# Patient Record
Sex: Female | Born: 1945 | Race: White | Hispanic: No | Marital: Married | Smoking: Never smoker
Health system: Southern US, Community
[De-identification: ages and names within clinical notes are randomized; demographics above are authoritative.]

## PROBLEM LIST (undated history)

## (undated) DIAGNOSIS — E785 Hyperlipidemia, unspecified: Secondary | ICD-10-CM

## (undated) DIAGNOSIS — E1169 Type 2 diabetes mellitus with other specified complication: Secondary | ICD-10-CM

## (undated) DIAGNOSIS — Z87442 Personal history of urinary calculi: Secondary | ICD-10-CM

## (undated) DIAGNOSIS — K219 Gastro-esophageal reflux disease without esophagitis: Secondary | ICD-10-CM

## (undated) DIAGNOSIS — F329 Major depressive disorder, single episode, unspecified: Secondary | ICD-10-CM

## (undated) DIAGNOSIS — IMO0002 Reserved for concepts with insufficient information to code with codable children: Secondary | ICD-10-CM

## (undated) DIAGNOSIS — F32A Depression, unspecified: Secondary | ICD-10-CM

## (undated) DIAGNOSIS — R87619 Unspecified abnormal cytological findings in specimens from cervix uteri: Secondary | ICD-10-CM

## (undated) DIAGNOSIS — E669 Obesity, unspecified: Secondary | ICD-10-CM

## (undated) DIAGNOSIS — F419 Anxiety disorder, unspecified: Secondary | ICD-10-CM

## (undated) HISTORY — DX: Anxiety disorder, unspecified: F41.9

## (undated) HISTORY — DX: Hyperlipidemia, unspecified: E78.5

## (undated) HISTORY — DX: Depression, unspecified: F32.A

## (undated) HISTORY — DX: Gastro-esophageal reflux disease without esophagitis: K21.9

## (undated) HISTORY — DX: Unspecified abnormal cytological findings in specimens from cervix uteri: R87.619

## (undated) HISTORY — PX: GALLBLADDER SURGERY: SHX652

## (undated) HISTORY — DX: Obesity, unspecified: E11.69

## (undated) HISTORY — DX: Major depressive disorder, single episode, unspecified: F32.9

## (undated) HISTORY — DX: Type 2 diabetes mellitus with other specified complication: E66.9

## (undated) HISTORY — DX: Reserved for concepts with insufficient information to code with codable children: IMO0002

## (undated) HISTORY — PX: TONSILLECTOMY: SHX5217

## (undated) HISTORY — DX: Personal history of urinary calculi: Z87.442

## (undated) HISTORY — PX: TUBAL LIGATION: SHX77

---

## 1975-06-17 HISTORY — PX: ECTOPIC PREGNANCY SURGERY: SHX613

## 1997-10-31 ENCOUNTER — Ambulatory Visit (HOSPITAL_COMMUNITY): Admission: RE | Admit: 1997-10-31 | Discharge: 1997-10-31 | Payer: Self-pay | Admitting: Family Medicine

## 1998-01-19 ENCOUNTER — Ambulatory Visit (HOSPITAL_BASED_OUTPATIENT_CLINIC_OR_DEPARTMENT_OTHER): Admission: RE | Admit: 1998-01-19 | Discharge: 1998-01-19 | Payer: Self-pay | Admitting: Orthopedic Surgery

## 1998-03-14 ENCOUNTER — Ambulatory Visit (HOSPITAL_COMMUNITY): Admission: RE | Admit: 1998-03-14 | Discharge: 1998-03-14 | Payer: Self-pay | Admitting: *Deleted

## 1998-03-20 ENCOUNTER — Other Ambulatory Visit: Admission: RE | Admit: 1998-03-20 | Discharge: 1998-03-20 | Payer: Self-pay | Admitting: Gastroenterology

## 2001-02-23 ENCOUNTER — Other Ambulatory Visit: Admission: RE | Admit: 2001-02-23 | Discharge: 2001-02-23 | Payer: Self-pay | Admitting: *Deleted

## 2002-05-24 ENCOUNTER — Other Ambulatory Visit: Admission: RE | Admit: 2002-05-24 | Discharge: 2002-05-24 | Payer: Self-pay | Admitting: *Deleted

## 2002-11-17 ENCOUNTER — Other Ambulatory Visit: Admission: RE | Admit: 2002-11-17 | Discharge: 2002-11-17 | Payer: Self-pay | Admitting: *Deleted

## 2003-05-30 ENCOUNTER — Other Ambulatory Visit: Admission: RE | Admit: 2003-05-30 | Discharge: 2003-05-30 | Payer: Self-pay | Admitting: *Deleted

## 2005-01-07 ENCOUNTER — Encounter: Admission: RE | Admit: 2005-01-07 | Discharge: 2005-01-07 | Payer: Self-pay | Admitting: *Deleted

## 2005-03-21 ENCOUNTER — Ambulatory Visit: Payer: Self-pay | Admitting: Gastroenterology

## 2005-04-03 ENCOUNTER — Ambulatory Visit: Payer: Self-pay | Admitting: Gastroenterology

## 2005-07-22 ENCOUNTER — Other Ambulatory Visit: Admission: RE | Admit: 2005-07-22 | Discharge: 2005-07-22 | Payer: Self-pay | Admitting: Obstetrics and Gynecology

## 2007-05-05 ENCOUNTER — Other Ambulatory Visit: Admission: RE | Admit: 2007-05-05 | Discharge: 2007-05-05 | Payer: Self-pay | Admitting: Family Medicine

## 2008-04-18 ENCOUNTER — Ambulatory Visit: Payer: Self-pay | Admitting: Nurse Practitioner

## 2009-05-29 ENCOUNTER — Ambulatory Visit: Payer: Self-pay | Admitting: Nurse Practitioner

## 2009-08-07 ENCOUNTER — Ambulatory Visit: Payer: Self-pay | Admitting: Nurse Practitioner

## 2009-09-11 ENCOUNTER — Ambulatory Visit: Payer: Self-pay | Admitting: Nurse Practitioner

## 2009-10-09 ENCOUNTER — Ambulatory Visit: Payer: Self-pay | Admitting: Nurse Practitioner

## 2009-11-06 ENCOUNTER — Ambulatory Visit: Payer: Self-pay | Admitting: Nurse Practitioner

## 2010-04-23 ENCOUNTER — Ambulatory Visit: Payer: Self-pay | Admitting: Nurse Practitioner

## 2010-09-14 ENCOUNTER — Inpatient Hospital Stay (HOSPITAL_COMMUNITY)
Admission: EM | Admit: 2010-09-14 | Discharge: 2010-09-15 | DRG: 694 | Disposition: A | Discharge status: Home / Self Care | Payer: Medicare Other | Attending: Internal Medicine | Admitting: Internal Medicine

## 2010-09-14 ENCOUNTER — Emergency Department (HOSPITAL_COMMUNITY): Payer: Medicare Other

## 2010-09-14 DIAGNOSIS — N12 Tubulo-interstitial nephritis, not specified as acute or chronic: Secondary | ICD-10-CM | POA: Diagnosis present

## 2010-09-14 DIAGNOSIS — N179 Acute kidney failure, unspecified: Secondary | ICD-10-CM | POA: Diagnosis present

## 2010-09-14 DIAGNOSIS — G43909 Migraine, unspecified, not intractable, without status migrainosus: Secondary | ICD-10-CM | POA: Diagnosis present

## 2010-09-14 DIAGNOSIS — K7689 Other specified diseases of liver: Secondary | ICD-10-CM | POA: Diagnosis present

## 2010-09-14 DIAGNOSIS — N1330 Unspecified hydronephrosis: Secondary | ICD-10-CM | POA: Diagnosis present

## 2010-09-14 DIAGNOSIS — I498 Other specified cardiac arrhythmias: Secondary | ICD-10-CM | POA: Diagnosis present

## 2010-09-14 DIAGNOSIS — F3289 Other specified depressive episodes: Secondary | ICD-10-CM | POA: Diagnosis present

## 2010-09-14 DIAGNOSIS — F329 Major depressive disorder, single episode, unspecified: Secondary | ICD-10-CM | POA: Diagnosis present

## 2010-09-14 DIAGNOSIS — N201 Calculus of ureter: Principal | ICD-10-CM | POA: Diagnosis present

## 2010-09-14 LAB — URINALYSIS, ROUTINE W REFLEX MICROSCOPIC
Bilirubin Urine: NEGATIVE
Glucose, UA: NEGATIVE mg/dL
Urobilinogen, UA: 1 mg/dL (ref 0.0–1.0)

## 2010-09-14 LAB — BASIC METABOLIC PANEL
Chloride: 111 mEq/L (ref 96–112)
Creatinine, Ser: 1.2 mg/dL (ref 0.4–1.2)
GFR calc Af Amer: 55 mL/min — ABNORMAL LOW (ref >60)
Glucose, Bld: 173 mg/dL — ABNORMAL HIGH (ref 70–99)
Sodium: 139 mEq/L (ref 135–145)

## 2010-09-14 LAB — DIFFERENTIAL
Basophils Relative: 0 % (ref 0–1)
Eosinophils Absolute: 0 10*3/uL (ref 0.0–0.7)
Lymphocytes Relative: 6 % — ABNORMAL LOW (ref 12–46)
Lymphs Abs: 0.8 10*3/uL (ref 0.7–4.0)
Monocytes Relative: 4 % (ref 3–12)
Neutrophils Relative %: 90 % — ABNORMAL HIGH (ref 43–77)

## 2010-09-14 LAB — URINE MICROSCOPIC-ADD ON

## 2010-09-14 LAB — CBC
MCHC: 33.2 g/dL (ref 30.0–36.0)
MCV: 90.9 fL (ref 78.0–100.0)
Platelets: 179 10*3/uL (ref 150–400)

## 2010-09-14 LAB — POCT CARDIAC MARKERS: Troponin i, poc: <0.05 ng/mL (ref 0.00–0.09)

## 2010-09-14 LAB — MRSA PCR SCREENING: MRSA by PCR: NEGATIVE

## 2010-09-15 ENCOUNTER — Inpatient Hospital Stay (HOSPITAL_COMMUNITY): Payer: Medicare Other

## 2010-09-15 LAB — COMPREHENSIVE METABOLIC PANEL
ALT: 18 U/L (ref 0–35)
AST: 19 U/L (ref 0–37)
Albumin: 2.6 g/dL — ABNORMAL LOW (ref 3.5–5.2)
Alkaline Phosphatase: 59 U/L (ref 39–117)
CO2: 21 mEq/L (ref 19–32)
Calcium: 7.8 mg/dL — ABNORMAL LOW (ref 8.4–10.5)
Chloride: 117 mEq/L — ABNORMAL HIGH (ref 96–112)
GFR calc non Af Amer: >60 mL/min (ref >60)
Glucose, Bld: 118 mg/dL — ABNORMAL HIGH (ref 70–99)
Potassium: 3.7 mEq/L (ref 3.5–5.1)
Total Bilirubin: 0.6 mg/dL (ref 0.3–1.2)

## 2010-09-15 LAB — CBC
MCH: 28.7 pg (ref 26.0–34.0)
MCHC: 31.3 g/dL (ref 30.0–36.0)

## 2010-09-15 NOTE — Discharge Summary (Signed)
NAME:  Sheri Soto, Sheri Soto                  ACCOUNT NO.:  [id number]  MEDICAL RECORD NO.:  [id number]           PATIENT TYPE:  I  LOCATION:  1240                         FACILITY:  [facility]  PHYSICIAN:  Conley Canal, MD      DATE OF BIRTH:  1945/11/05  DATE OF ADMISSION:  09/14/2010 DATE OF DISCHARGE:  09/15/2010                         DISCHARGE SUMMARY-REFERRING   PRIMARY CARE PHYSICIAN:  Sheri Proctor. Shawnie Pons, MD  CONSULTING PHYSICIANS:  Sheri Roach McDiarmid, MD, Urology.  DISCHARGE DIAGNOSES: 1. Left-sided pyelonephritis secondary to obstructing 5-mm calculus at     the left ureterovesical junction associated with hydronephrosis. 2. Migraine headaches. 3. Bradycardia, beta-blocker induced. 4. Low-density lesion in the dome of the right hepatic lobe which will     need further evaluation with MRI with and without contrast     electively.  Would defer this decision to the patient's primary     care provider. 5. Depression.  DISCHARGE MEDICATIONS: 1. Ciprofloxacin 400 mg twice daily for 8 more days. 2. Senokot 2 tablets daily as needed. 3. Ambien 10 mg nightly. 4. Atenolol 100 mg nightly. 5. Pristiq 50 mg daily. 6. Topamax 200 mg daily at bedtime.  PROCEDURES PERFORMED:  CT abdomen and pelvis without contrast on Much 31, 2012, which showed obstructing 5-mm calculus at the left ureterovesical junction with associated hydronephrosis and also showed nonobstructing calculus in the lower pole of the right kidney, some nonspecific of oblong low-density lesion in the dome of the right hepatic lobe.  HOSPITAL COURSE:  Sheri Soto is a pleasant 65 year old female who came in with acute left flank pain and was found to have obstructing 5-mm calculus in the left ureterovesical junction with resultant hydronephrosis and pyelonephritis.  At the time of admission, she was slightly hypotensive with blood pressure in the 90s systolic and her white count was 13.9, BUN 16, and creatinine 1.20.  The patient  was hence started on ceftriaxone with appropriate response.  Her white count improved to 5.5.  Blood pressure has since normalized.  She was seen by Urology, Dr. McDiarmid with [facility] and his recommendation was to continue antibiotics and also gave a prescription for Vicodin and Flomax and for the patient to follow with [facility] in the office in the next week.  Otherwise, the patient has done well.  Today, her labs include white count 5.5, hemoglobin 11, hematocrit 35.1, platelet count 120, sodium 140, potassium 3.7, BUN 14, and creatinine 0.83.  LFTs essentially unremarkable.  Urine culture results pending at the time of discharge.  The patient will need further evaluation of liver lesion which was poorly characterized on CT of abdomen and pelvis.  She will need an MRI once other issues have stabilized.  She was instructed to come to the emergency room if she continues to have abdominal pain.  She should follow up with Dr. Tinnie Soto in the next 1-2 weeks.  She is discharged in stable condition.  The time spent for this discharge preparation is less than 30 minutes.     Conley Canal, MD     SR/MEDQ  D:  09/15/2010  T:  09/15/2010  Job:  [id number]  cc:   Sheri Soto, M.D.  Sheri Soto, M.D.  Electronically Signed by Conley Canal  on 09/15/2010 05:51:24 PM

## 2010-09-17 LAB — URINE CULTURE: Culture  Setup Time: 201203311343

## 2010-09-23 NOTE — H&P (Signed)
NAME:  Sheri Soto, Sheri Soto                  ACCOUNT NO.:  [id number]  MEDICAL RECORD NO.:  [id number]           PATIENT TYPE:  I  LOCATION:  1240                         FACILITY:  [facility]  PHYSICIAN:  Sheri Soto, M.D.   DATE OF BIRTH:  Mar 06, 1946  DATE OF ADMISSION:  09/14/2010 DATE OF DISCHARGE:                             HISTORY & PHYSICAL   PRIMARY CARE PHYSICIAN:  Sheri Soto, M.D. at [facility].  CHIEF COMPLAINT:  Left flank pain.  HISTORY OF PRESENT ILLNESS:  The patient is a 65 year old female with no significant past medical history who awoke this morning at 3:00 a.m. with severe left-sided flank pain that was sudden in onset, rated 10/10, and constant.  She denies any associated dysuria, fever, or chills.  She has had some nausea but no frank vomiting or diarrhea since the pain started.  She has no history of frequent urinary tract infections, no history of recent hematuria, and no history of nephrolithiasis.  Upon initial evaluation in the Emergency Department, the patient was found to be hypotensive, in acute renal failure, and had pyuria and bacteriuria as well as hematuria on urinalysis and therefore has been referred to the Hospitalist Service for inpatient evaluation and treatment.  PAST MEDICAL HISTORY: 1. Depression. 2. Migraine headaches. 3. Anxiety. 4. Insomnia. 5. Remote history of ectopic pregnancy in 1977.  PAST SURGICAL HISTORY: 1. Tubal ligation. 2. Tonsillectomy. 3. Cholecystectomy.  FAMILY HISTORY:  The patient's father died at 63 from a massive MI.  He also had a history of colon cancer.  The patient's mother died at 61 with metastatic colon cancer and liver cancer.  The patient has a brother and a sister, both of whom are healthy.  She has three healthy offspring.  SOCIAL HISTORY:  The patient is married with three children.  There is no history of tobacco, alcohol, or drug use.  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  ALLERGIES:  NO KNOWN DRUG ALLERGIES.  CURRENT MEDICATIONS:  The patient cannot recall these.  However, a review of Dr. Tawni Levy notes does indicate that she takes Ambien, Topamax, atenolol, Amerge, Pristiq, and Xanax at unspecified dosages.  REVIEW OF SYSTEMS:  CONSTITUTIONAL:  No changes in appetite.  Her weight has been stable.  HEENT:  Recent upper respiratory infection with pharyngitis and cough, now clear.  She does wear glasses. CARDIOVASCULAR:  No chest pain or palpitations.  RESPIRATORY:  No shortness of breath or current cough.  GASTROINTESTINAL:  Positive for nausea, no vomiting, diarrhea, melena or hematochezia.  GENITOURINARY: No dysuria or frank hematuria.  MUSCULOSKELETAL:  Left flank pain. NEUROLOGIC:  History of migraine headaches.  PSYCHIATRIC:  History of depression and anxiety.  Comprehensive 14-point review of systems is otherwise unremarkable.  PHYSICAL EXAMINATION:  VITAL SIGNS:  Temperature 98.4, pulse 45, respirations 16, blood pressure 103/46, O2 saturation 99% on room air. GENERAL:  Well-developed, well-nourished Caucasian female in no acute distress. HEENT:  Normocephalic, atraumatic.  PERRL.  EOMI.  Oropharynx is clear. NECK:  Supple, no thyromegaly, no lymphadenopathy, no jugular venous distention. CHEST:  Lungs clear  to auscultation bilaterally with good air movement. HEART:  Bradycardic rate, regular rhythm. ABDOMEN:  Soft, nontender, nondistended.  Normoactive bowel sounds. EXTREMITIES:  No clubbing, edema, or cyanosis. SKIN:  Warm and dry.  No rashes. NEUROLOGIC:  The patient is alert and oriented x3.  Cranial nerves II- XII grossly intact.  Nonfocal.  LABORATORY DATA:  White blood cell count of 13.9, hemoglobin 13.6, hematocrit 41, platelets 179.  Sodium is 139, potassium 4.0, chloride 111, bicarbonate 23, BUN 16, creatinine 1.20, glucose 173, calcium 8.8. Point-of-care cardiac markers are negative.  Urinalysis reveals  21-50 white blood cells, too numerous to count red blood cells and many bacteria.  RADIOLOGIC:  CT scan of the abdomen and pelvis show an obstructing 5-mm calculus at the left ureterovesical junction with associated hydronephrosis.  Nonobstructing calculus in the lower pole of the right kidney.  Nonspecific oblong low-density lesion in the dome of the right hepatic lobe and based on location would likely be difficult to evaluate with ultrasound.  Abdominal MRI with and without contrast would be considered the study of choice for characterization of liver lesions if clinically warranted.  ASSESSMENT AND PLAN: 1. Sepsis secondary to pyelonephritis:  The patient will be admitted     and fluid volume resuscitated.  She will be placed on Rocephin     pending urine culture data.  She was monitored closely in the step-     down unit for hemodynamic stability. 2. Acute renal failure secondary to hydronephrosis:  The patient will     be hydrated vigorously and monitored closely. 3. Obstructing nephrolithiasis:  Urology consultation has been     requested.  The patient will be provided with pain medications as     needed while awaiting Urology consultation. 4. Bradycardia:  Induced by beta-blocker taken for migraine     prophylaxis.  At this point, we would hold the beta-blocker given     her hypotension and bradycardia. 5. Liver lesions:  These are incidentally found.  Would get an     abdominal MRI when the patient is medically stable and     hemodynamically stable. 6. History of migraine headaches:  We will continue the patient's     Amerge as needed but hold her beta-blocker.  Topamax can be resumed     when her dosage is clarified. 7. Depression/anxiety:  We will continue the patient's Pristiq and as     needed Xanax. 8. Prophylaxis:  We will use PAS hoses for deep vein thrombosis     prophylaxis given her hematuria and potential need for urological     intervention.  Time spent on  admission including face-to-face time was approximately one hour.     Sheri Soto, M.D.     CR/MEDQ  D:  09/14/2010  T:  09/14/2010  Job:  [id number]  cc:   Sheri Soto. Shawnie Soto, M.D.  Electronically Signed by Sheri Soto M.D. on 09/23/2010 12:36:13 PM

## 2010-10-01 ENCOUNTER — Encounter (INDEPENDENT_AMBULATORY_CARE_PROVIDER_SITE_OTHER): Payer: Federal, State, Local not specified - PPO | Admitting: Nurse Practitioner

## 2010-10-01 DIAGNOSIS — G43109 Migraine with aura, not intractable, without status migrainosus: Secondary | ICD-10-CM

## 2010-10-02 NOTE — Assessment & Plan Note (Unsigned)
NAME:  Sheri Soto, Sheri Soto NO.:  [id number]  MEDICAL RECORD NO.:  [id number]           PATIENT TYPE:  I  LOCATION:  [facility]         FACILITY:  [facility]  PHYSICIAN:  Argentina Donovan, MD        DATE OF BIRTH:  [birth year]  DATE OF SERVICE:  10/01/2010                                 CLINIC NOTE  The patient comes to the office today for followup on her migraine headaches.  Mr. Greenspan and I talked on the phone last week and she informed me that she was having some issues with kidney stones and that she was scheduled for surgery on Friday to have a stent placed.  At that time, we talked about her tapering down and off the Topamax which can be a factor with kidney stones.  Her kidney stone started on March 31.  She has stones on each side.  The left side is 6-mm and the right side is smaller, but she was not told an exact measurement.  She admits that she drinks very little water, drinks mostly tea and soda.  She states that her headaches have been okay since she has been off the Topamax.  She did have one headache while she was in the hospital, but otherwise has not had any headaches.  She will remain on her atenolol and Pristiq for headache prevention and she will follow up after the stents are placed on Friday.  PHYSICAL EXAMINATION:  VITAL SIGNS:  Blood pressure is 133/85, pulse is 71 and weight 66. GENERAL:  Well-developed, well-nourished 65 year old Caucasian female, in no acute distress. HEENT:  Head is normocephalic and atraumatic.  Pupils equally react. NEUROLOGIC:  The patient is alert and oriented.  She has good muscle tone and good coordination, good sensation.  Her affect is appropriate.  ASSESSMENT: 1. Migraine. 2. Kidney stones.  PLAN:  Again, we have tapered and discontinued her Topamax.  She is advised that she should not return to this medication.  She is also advised that she should drink more water and less caffeinated beverages. She will  remain on atenolol and Pristiq for headache prevention.  She will use her other medications acutely as needed.  She will return at following her kidney surgery or call if she has an issue before that.     Remonia Richter, NP   ______________________________ Argentina Donovan, MD   LR/MEDQ  D:  10/01/2010  T:  10/02/2010  Job:  [id number]

## 2010-10-04 ENCOUNTER — Ambulatory Visit (HOSPITAL_COMMUNITY)
Admission: RE | Admit: 2010-10-04 | Discharge: 2010-10-04 | Disposition: A | Discharge status: Home / Self Care | Payer: Federal, State, Local not specified - PPO | Source: Ambulatory Visit | Attending: Urology | Admitting: Urology

## 2010-10-04 ENCOUNTER — Ambulatory Visit (HOSPITAL_BASED_OUTPATIENT_CLINIC_OR_DEPARTMENT_OTHER)
Admission: RE | Admit: 2010-10-04 | Discharge: 2010-10-04 | Disposition: A | Discharge status: Home / Self Care | Payer: Federal, State, Local not specified - PPO | Source: Ambulatory Visit | Attending: Urology | Admitting: Urology

## 2010-10-04 DIAGNOSIS — Z01818 Encounter for other preprocedural examination: Secondary | ICD-10-CM | POA: Insufficient documentation

## 2010-10-04 DIAGNOSIS — Z9089 Acquired absence of other organs: Secondary | ICD-10-CM | POA: Insufficient documentation

## 2010-10-04 DIAGNOSIS — R109 Unspecified abdominal pain: Secondary | ICD-10-CM | POA: Insufficient documentation

## 2010-10-04 DIAGNOSIS — Z01812 Encounter for preprocedural laboratory examination: Secondary | ICD-10-CM | POA: Insufficient documentation

## 2010-10-04 DIAGNOSIS — M76899 Other specified enthesopathies of unspecified lower limb, excluding foot: Secondary | ICD-10-CM | POA: Insufficient documentation

## 2010-10-04 DIAGNOSIS — I998 Other disorder of circulatory system: Secondary | ICD-10-CM | POA: Insufficient documentation

## 2010-10-04 DIAGNOSIS — N8111 Cystocele, midline: Secondary | ICD-10-CM | POA: Insufficient documentation

## 2010-10-04 DIAGNOSIS — R1032 Left lower quadrant pain: Secondary | ICD-10-CM | POA: Insufficient documentation

## 2010-10-04 DIAGNOSIS — N200 Calculus of kidney: Secondary | ICD-10-CM | POA: Insufficient documentation

## 2010-10-04 DIAGNOSIS — N201 Calculus of ureter: Secondary | ICD-10-CM | POA: Insufficient documentation

## 2010-10-04 LAB — POCT HEMOGLOBIN-HEMACUE: Hemoglobin: 13.3 g/dL (ref 12.0–15.0)

## 2010-10-05 NOTE — Op Note (Signed)
NAME:  Sheri Soto, Sheri Soto                  ACCOUNT NO.:  [id number]  MEDICAL RECORD NO.:  [id number]           PATIENT TYPE:  O  LOCATION:  XRAY                         FACILITY:  [facility]  PHYSICIAN:  Daxson Reffett C. Vernie Ammons, M.D.  DATE OF BIRTH:  1945-07-24  DATE OF PROCEDURE:  10/04/2010 DATE OF DISCHARGE:                              OPERATIVE REPORT   PREOPERATIVE DIAGNOSES: 1. Left ureteral stone. 2. Right flank pain.  POSTOPERATIVE DIAGNOSES: 1. Passed left ureteral stone. 2. Right ureteral pain.  PROCEDURE: 1. Bilateral retrograde pyelograms with interpretation. 2. Right ureteroscopy with stone extraction.  SURGEON:  Saurabh Hettich C. Vernie Ammons, M.D.  ANESTHESIA:  General.  BLOOD LOSS:  None.  DRAINS:  None.  SPECIMENS:  Stone given to the patient.  COMPLICATIONS:  None.  INDICATIONS:  The patient is a 65 year old female who was diagnosed with a left ureteral stone after having gone to the emergency room experiencing left lower quadrant pain.  CT scan at that time revealed a stone in the lower pole of the right kidney without evidence of obstruction as well as a 5-mm stone at the ureterovesical junction. Followup KUB revealed the stone had not progressed and the patient was maintained on medical expulsive therapy but despite this, the stone was not seen to have progressed.  We therefore discussed ureteroscopic extraction of the stone and went over the risks, complications, and alternatives.  She understood and elected to proceed with surgery.  When she was seen in the preop holding area, she had mentioned that she had begun to experience severe right-sided flank pain and therefore we discussed evaluating the right side as well with retrograde pyelogram at the time of surgery.  DESCRIPTION OF OPERATION:  After informed consent, the patient was brought to the major OR, placed on the table and administered general anesthesia.  She was then moved to the dorsal lithotomy position and  her genitalia was sterilely prepped and draped.  An official time-out was then performed.  A 22-French rigid cystoscope with 12-degree lens was then passed into the bladder under direct vision and the bladder was systematically and fully inspected.  No tumor, stones or inflammatory lesions were seen. She was noted to have significant cystocele.  The 6-French open-ended ureteral catheter was then passed through the cystoscope and into the left ureteral orifice.  Full strength contrast was then injected under direct fluoroscopic vision and as this progressed up the ureter, it was noted to be entirely normal with no evidence of mass effect or filling defects.  The collecting system of the left kidney appeared entirely normal as well.  The contrast was then watched as it passed down the ureter unobstructed and out of orifice with no stone being located in the left ureter.  I then turned my attention to the right ureter and performed a right retrograde pyelogram in identical fashion.  However, this time, Other than the system being entirely normal, there was an area that appeared to be a filling defect in the distal ureter most consistent with a stone.  A 0.038-inch floppy tip guidewire was then passed through the open-ended stent and  up the right ureter.  I then used the inner cannula of the ureteral access sheath and gently dilated the intramural ureter.  This was then removed and 6-French rigid ureteroscope was then passed under direct vision into the right ureteral orifice and easily passed into the ureter where the stone was identified.  It was grasped with a nitinol basket and extracted without resistance.  I then drained the bladder and the patient was awakened and taken to recovery room in stable and satisfactory condition.  She tolerated the procedure well and there were no intraoperative complications.  She is given a prescription for Vicodin #12 and Pyridium 200 mg  #12.     Nayda Riesen C. Vernie Ammons, M.D.     MCO/MEDQ  D:  10/04/2010  T:  10/04/2010  Job:  [id number]  Electronically Signed by Ihor Gully M.D. on 10/05/2010 03:40:04 PM

## 2010-10-29 ENCOUNTER — Encounter: Payer: Federal, State, Local not specified - PPO | Admitting: Obstetrics & Gynecology

## 2010-10-29 NOTE — Assessment & Plan Note (Signed)
NAME:  Sheri Soto, Sheri Soto                  ACCOUNT NO.:  [id number]   MEDICAL RECORD NO.:  [id number]          PATIENT TYPE:  POB   LOCATION:  [facility]         FACILITY:  [facility]   PHYSICIAN:  Tinnie Gens, MD        DATE OF BIRTH:  11-26-1945   DATE OF SERVICE:  04/23/2010                                  CLINIC NOTE   HISTORY OF PRESENT ILLNESS:  The patient comes to the office today for  followup on her migraine headaches.  The patient was last seen in May  2011.  Since then, we had stabilized her on Pristiq at 100 mg.  The  patient has had some upsetting things happen in her life.  Her grandson  was born with a severely deformed foot that will have to be amputated  and a prosthesis placed.  This has been needless to say upsetting to the  family.  She feels at this point that she would like to go up on the  Pristiq.  She does have some 50-mg tablets at home that she could use to  go up to 150 and then will write her new prescription for 200 mg.  She  also needs a medication refill on her Ambien, Topamax, atenolol, Amerge  Pristiq, and Xanax.   PHYSICAL EXAMINATION:  VITAL SIGNS:  Blood pressure is 131/77, pulse 50,  weight 173, height is 5 feet.  GENERAL:  Well-developed, well-nourished 65 year old Caucasian female in  no acute distress.  HEENT:  Head is normocephalic and atraumatic.  Pupils are equal and  react.  CARDIAC:  Regular rate and rhythm.  LUNGS:  Clear bilaterally.  NEUROLOGIC:  The patient is alert, oriented, appropriately upset about  her grandson.   ASSESSMENT:  Migraine.   PLAN:  The patient will have her medications refilled.  She was given  emotional support in the office today.  She will return to the clinic in  6 months or sooner as need be.      Remonia Richter, NP    ______________________________  Tinnie Gens, MD    LR/MEDQ  D:  04/23/2010  T:  04/24/2010  Job:  [id number]

## 2010-10-29 NOTE — Assessment & Plan Note (Signed)
NAME:  Sheri Soto, Sheri Soto NO.:  [id number]   MEDICAL RECORD NO.:  [id number]          PATIENT TYPE:  POB   LOCATION:  [facility]         FACILITY:  [facility]   PHYSICIAN:  Argentina Donovan, MD        DATE OF BIRTH:  [birth year]   DATE OF SERVICE:                                  CLINIC NOTE   The patient comes to the office today for an acute visit.  The patient  had switched over to generic Effexor at 225 mg.  She began to have  nausea which worsened and then she ultimately started vomiting.  She  stopped the medication abruptly and has had some issues since then.  We  had a lengthy conversation about her options.  She would like to try  something different, so we will start her on Pristiq 50 mg 1 p.o. #30  with 3 refills until it gets into her system, she will take Xanax 0.25  mg 1 p.o. t.i.d. #30 with no refills.  The patient is asked not to stop  her medications abruptly in the future.  She will return in 3 weeks and  we will evaluate at that point.      Remonia Richter, NP    ______________________________  Argentina Donovan, MD    LR/MEDQ  D:  09/11/2009  T:  09/12/2009  Job:  [id number]

## 2010-10-29 NOTE — Assessment & Plan Note (Signed)
NAME:  Sheri Soto, Sheri Soto                  ACCOUNT NO.:  [id number]   MEDICAL RECORD NO.:  [id number]          PATIENT TYPE:  POB   LOCATION:  [facility]         FACILITY:  [facility]   PHYSICIAN:  Allie Bossier, MD        DATE OF BIRTH:  1945-11-27   DATE OF SERVICE:  05/29/2009                                  CLINIC NOTE   The patient comes to the office today for followup on her migraine  headaches.  The patient was last seen in November 2009.  She did not  keep her followup appointment.  She did not do the sleep study that we  talked about in her previous office visit.  Since then, she feels that  her headache severity is the same but, she just may be having less  headaches in general.  She is still having some stress-related issues  concerning her children.  Her son recently had a DUI and they have been  financially stressed with that as well as having to drive him about.  Her headache location has changed from the side to the middle of her  forehead.  We did have a lengthy discussion concerning her medications  and there are changes that she would like to make.   PHYSICAL EXAMINATION:  VITAL SIGNS:  Blood pressure is 103/72, pulse is  74, weight 172, height is 5 feet 0 inch.   ASSESSMENT:  1. Migraine headaches.  2. Anxiety, depression.  3. Insomnia.   PLAN:  We re-discussed her having a sleep study.  This is something that  we did discuss at length in the past note.  She is also going to  increase her Topamax from 100 mg she will go up to 150 mg for 1-2 weeks  and then will go up to 200 mg.  We will also change her Effexor from the  extended release to the non extended release if that may be affecting  her sleep.  We will change her to Effexor 75 mg 3 tablets daily.  We  will give her atenolol 100 mg.  She is also asking for a refill on  Ambien 10 mg and her Xanax.  We will see her back in 3 months.  She will  make the changes to her medication, have her sleep study, and then  we  will review at that time.      Remonia Richter, NP    ______________________________  Allie Bossier, MD    LR/MEDQ  D:  05/29/2009  T:  05/30/2009  Job:  [id number]

## 2010-10-29 NOTE — Assessment & Plan Note (Signed)
NAME:  Sheri Soto, Sheri Soto                  ACCOUNT NO.:  [id number]   MEDICAL RECORD NO.:  [id number]          PATIENT TYPE:  POB   LOCATION:  [facility] at [facility]         FACILITY:  [facility]   PHYSICIAN:  Argentina Donovan, MD        DATE OF BIRTH:  01-May-1946   DATE OF SERVICE:                                  CLINIC NOTE   The patient comes today for consultation on her migraine headaches.  The  patient has had migraine headaches since her teens.  She is currently  88.  She is a well-known patient to me, has been taken care of me at  [facility].  She does have migraine without aura.  Her  headache is located usually in her bilateral temples.  It seems to be  worse recently.  She does not have any explanation for this other than  some increased anxiety concerning money and sleep issues.  In a31-month  period, she may have 5-6 milds, 10-15 moderates, and 2 severe.  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  Her triggers include  stress, sleep, money issues, and heat and humidity.  We did have a  lengthy discussion concerning her sleep.  She has been on Ambien for  number of years, approximately 15.  She has difficulty falling asleep.  She has difficulty maintaining sleep.  She has some daytime fatigue.  She left to her own devices, would wake up every 15-20 minutes without  the Ambien.  She does say that her husband snores.  He has never told  her she has any issues with apnea.  She does have some obesity, and she  has been on atenolol for number of years, although that was put on as a  headache prevention.   MEDICINE ALLERGIES:  None.   OBSTETRICAL HISTORY:  She has been pregnant 4 times and has 3 children.   GYNECOLOGIC HISTORY:  She had a Pap smear 1 year ago.  She has had an  abnormal Pap smear in the past, approximately 5 years ago.  She did have  a normal mammogram 1 year ago.  She had a colonoscopy 3 years ago.   SURGICAL HISTORY:  She has never had a blood  transfusion.  She had a  tubal ligation 29 years ago.  She had an ectopic pregnancy in 1977.  She  had her tonsils removed and her gallbladder removed.   PERSONAL HISTORY:  She has a history of migraine without aura as well as  depression.   SOCIAL HISTORY:  She lives alone at home with her spouse.  She does not  work outside of the home.  She does not smoke.  She does not drink.  She  drinks 2-3 caffeinated beverages per day.  She has not been abused.   SYSTEMS REVIEW:  Negative for bruising, numbness, swelling, muscle  aches, weight loss, dizzy spells, nausea, or vomiting.  Positive for  headache.   PHYSICAL EXAMINATION:  GENERAL:  A well-developed, well-nourished obese  65 year old Caucasian female in no acute distress.  HEENT:  Head is normocephalic and atraumatic.  Pupils equal and  reactive.  CARDIAC:  Regular rate and rhythm.  No murmurs, rubs, or thrills.  LUNGS:  Clear bilaterally.  NEUROLOGICAL:  The patient is alert,oriented, and intact.  She has good  coordination and sensation.  Her thought pattern is cohesive and  coherent.  VITAL SIGNS:  Blood pressure is 125/73, pulse is 54, weight is 171, and  height is 5 feet zero inches.   ASSESSMENT AND PLAN:   ASSESSMENT:  1. Migraine without aura.  2. Anxiety.  3. Depression.  4. Insomnia.   PLAN:  1. We had a lengthy discussion concerning the patient having a sleep      study.  This is strongly recommended in her case given her sleep      history and her long-term use of Ambien.  The patient will consider      this.  She feels that she needs to check with her insurance      companies.  2. We did trigger point injections today.  She had a total of 5 mL,      split between each temple bilaterally.  We did also talk about the      dosing of her Effexor.  She has been skipping doses.  If she misses      taking it in the morning, we did discuss her spacing this out, and      this should help her.  We will refill her  medicines today.  She has      been given a refill for Effexor, atenolol, Topamax, Ambien, Amerge,      and Xanax.  These are copied and placed in the chart.  At last, we      will get her medical records from the [facility].      The patient is asked to return in 3 months or sooner.      Remonia Richter, NP    ______________________________  Argentina Donovan, MD    LR/MEDQ  D:  04/18/2008  T:  04/19/2008  Job:  [id number]

## 2010-10-29 NOTE — Assessment & Plan Note (Signed)
NAME:  Sheri Soto, Sheri Soto                  ACCOUNT NO.:  [id number]   MEDICAL RECORD NO.:  [id number]          PATIENT TYPE:  POB   LOCATION:  [facility] at [facility]         FACILITY:  [facility]   PHYSICIAN:  Allie Bossier, MD        DATE OF BIRTH:  13-Oct-1945   DATE OF SERVICE:  10/09/2009                                  CLINIC NOTE   The patient comes to the office today for followup on her migraine  headaches.  The patient was last seen on September 11, 2009, at that point  she had come off of her Effexor secondary to side effects including  nausea and vomiting and was doing very poorly.  We started her on Xanax  for few days and Pristiq.  She has done very well, but she does not  think that the dose is high enough.  She has had some external stressors  including a death of her father-in-law, her son going to court and her  husband's health.  We discussed the options going up on her Pristiq, the  maximum dose is 400 mg.   ALLERGIES:  No known drug allergies.   PHYSICAL EXAMINATION:  VITAL SIGNS:  Blood pressure is 126/74, pulse is  50, weight is 173, height is 5 feet.  GENERAL:  Well-developed, well-nourished obese Caucasian female in no  acute distress.  HEENT:  Head is normocephalic and atraumatic.  Pupils are equal, round  and reactive.  NEUROLOGIC:  The patient is alert, oriented.  She is anxious, but not  overly.  She has a good nerve and muscle coordination.   ASSESSMENT:  Migraine.   PLAN:  We will go up on her Pristiq to 100 mg 1 p.o. daily #30 with 3  refills.  She will return to the clinic in 4 weeks.      Remonia Richter, NP    ______________________________  Allie Bossier, MD    LR/MEDQ  D:  10/09/2009  T:  10/10/2009  Job:  [id number]

## 2010-10-29 NOTE — Assessment & Plan Note (Signed)
NAME:  Sheri Soto, RIEVES                  ACCOUNT NO.:  [id number]   MEDICAL RECORD NO.:  [id number]          PATIENT TYPE:  POB   LOCATION:  [facility]         FACILITY:  [facility]   PHYSICIAN:  Scheryl Darter, MD       DATE OF BIRTH:  03/19/1946   DATE OF SERVICE:  08/07/2009                                  CLINIC NOTE   The patient comes to the office today for a followup on her migraine  headaches.  The patient feels overall she is doing better with her  headaches and has only had 2 headaches in the last month.  She did go up  on the Topamax to 200 mg.  She did go up on the Effexor to 225 mg and  she remains at 100 mg on the atenolol.  We did talk about her family  life things are getting a little bit better with her son.  We also  talked about her failure to have her sleep study done.  She states that  is due to high deductible and not being able to afford it at this time.  She would like to just stay where she is as far as her medications go.   Vital signs, blood pressure is 116/70, pulse is 54, weight 174.   ALLERGIES:  No known drug allergies.   ASSESSMENT:  Migraine headaches.   PLAN:  The patient will hold with her medications at this time.  She  will add Biofreeze to her forehead when need be.  We will hold on her  sleep study and she will return to clinic in 6 months or sooner as  needed.      Remonia Richter, NP    ______________________________  Scheryl Darter, MD    LR/MEDQ  D:  08/07/2009  T:  08/07/2009  Job:  [id number]

## 2010-10-29 NOTE — Assessment & Plan Note (Signed)
NAME:  Sheri Soto, ROZYCKI                  ACCOUNT NO.:  [id number]   MEDICAL RECORD NO.:  [id number]          PATIENT TYPE:  POB   LOCATION:  [facility] at [facility]         FACILITY:  [facility]   PHYSICIAN:  Remonia Richter, NP   DATE OF BIRTH:  02-07-1946   DATE OF SERVICE:  11/05/2009                                  CLINIC NOTE   The patient comes to office today for followup on her migraine  headaches.  On her last office visit on 04/26, we had decided to go up  on her Pristiq to 100 mg and have her come back in 4 weeks.  Since then  she felt that she has done extremely well on the Pristiq.  She is doing  great with her headaches and would like to stay where she is with that.   PHYSICAL EXAMINATION:  GENERAL:  Well-developed, well-nourished,  overweight 65 year old Caucasian female in no acute distress.  HEENT:  Head is normocephalic and atraumatic.  Pupils equal and react.  CARDIAC:  Regular rate and rhythm.  LUNGS:  Clear bilaterally.   ASSESSMENT:  Migraine.   PLAN:  The patient will hold her Pristiq at 100 mg p.o. daily.  She will  maintain her other medications and call back when she needs refills.  She will return to clinic in 6 months or sooner as needed.      Remonia Richter, NP     LR/MEDQ  D:  11/06/2009  T:  11/07/2009  Job:  [id number]

## 2011-02-04 ENCOUNTER — Ambulatory Visit (INDEPENDENT_AMBULATORY_CARE_PROVIDER_SITE_OTHER): Payer: Medicare Other | Admitting: Nurse Practitioner

## 2011-02-04 ENCOUNTER — Encounter: Payer: Self-pay | Admitting: Nurse Practitioner

## 2011-02-04 DIAGNOSIS — G43009 Migraine without aura, not intractable, without status migrainosus: Secondary | ICD-10-CM

## 2011-02-04 DIAGNOSIS — G43909 Migraine, unspecified, not intractable, without status migrainosus: Secondary | ICD-10-CM | POA: Insufficient documentation

## 2011-02-04 MED ORDER — LEVETIRACETAM ER 500 MG PO TB24: 500.0000 mg | ORAL_TABLET | Freq: Every day | ORAL | Status: DC

## 2011-02-04 NOTE — Progress Notes (Signed)
  Subjective:    Patient ID: Sheri Soto, female    DOB: 06/19/1945, 65 y.o.   MRN: [id number]  HPI Headache Visit. Last seen April 2012. At that time she was taken off Topamax for kidney stones. Has stent and has done well since then. Feels that headaches have gotten worse since being off and would like to take something like Topamax. Feels she is having a nearly daily headache. Severe ones are controlled by Amerge.     Review of Systems  Constitutional: Negative.   Eyes: Negative.   Respiratory: Negative.   Cardiovascular: Negative.   Gastrointestinal: Negative.   Musculoskeletal: Negative.   Neurological: Positive for headaches.  Psychiatric/Behavioral: Negative.        Objective:   Physical Exam Well developed, well nourished obese Cauc female. HEENT neg, MS negative, Neuro neg        Assessment & Plan:  A: Migraine Headache P: Start Keppra XR daily RTC 2 months or prn

## 2011-04-15 ENCOUNTER — Encounter: Payer: Self-pay | Admitting: Nurse Practitioner

## 2011-04-15 ENCOUNTER — Ambulatory Visit: Payer: Federal, State, Local not specified - PPO | Admitting: Nurse Practitioner

## 2011-04-15 VITALS — BP 135/79 | HR 51 | Wt 174.0 lb

## 2011-04-15 DIAGNOSIS — G4700 Insomnia, unspecified: Secondary | ICD-10-CM

## 2011-04-15 MED ORDER — ZOLPIDEM TARTRATE ER 12.5 MG PO TBCR
12.5000 mg | EXTENDED_RELEASE_TABLET | Freq: Every evening | ORAL | Status: DC | PRN
Start: 2011-04-15 — End: 2011-09-23

## 2011-04-15 NOTE — Progress Notes (Signed)
S: Pt returns today for follow up on migraine headaches. Last visit she was taken off Topamax due to kidney stones and placed on Keppra 500mg  daily. She has done very well with this. She has had 1 severe migraine and 5-5 milds. She is very pleased with this. She may have gained a few pounds. C/O ambien 10mg  not " holding " her for the entire night. Has taken the CR formulation in past and that has worked well for her. Pt is requesting Tdap today for prevention of whooping cough. She is babysitting her grandchildren.   O: Alert, oriented, NAD. Cardiac RRR. Lungs clear. Skin warm and dry. Neuro Negative  A: Migraine Insomnia  P: Stay with Keppra and Amerge Change Ambien to CR formulation Give Tdap today RTC 6 months or prn

## 2011-05-20 ENCOUNTER — Telehealth: Payer: Self-pay

## 2011-05-20 NOTE — Telephone Encounter (Signed)
PATIENT CALLED YESTERDAY AND SPOKE WITH CAMILLE ABOUT HER AMBIEN. IT NEEDS PRIORITIZATION. PLEASE CALL HER PHARMACY TO APPROVE IT AT [phone] [facility]. THANKS! PATIENT ONLY HAS A COUPLE OF DAYS LEFT.

## 2011-05-21 NOTE — Telephone Encounter (Signed)
Meds were authorized for one year.

## 2011-05-22 ENCOUNTER — Other Ambulatory Visit: Payer: Self-pay | Admitting: *Deleted

## 2011-05-22 MED ORDER — ATENOLOL 100 MG PO TABS: 100.0000 mg | ORAL_TABLET | Freq: Every day | ORAL | Status: DC

## 2011-05-22 NOTE — Telephone Encounter (Signed)
Patient needs a refill of atenolol sent to her mail order pharmacy.  Done with 3 months and three refills.

## 2011-09-23 ENCOUNTER — Ambulatory Visit (INDEPENDENT_AMBULATORY_CARE_PROVIDER_SITE_OTHER): Payer: Medicare Other | Admitting: Nurse Practitioner

## 2011-09-23 ENCOUNTER — Encounter: Payer: Self-pay | Admitting: Nurse Practitioner

## 2011-09-23 VITALS — BP 141/76 | HR 50 | Ht 60.0 in | Wt 172.0 lb

## 2011-09-23 DIAGNOSIS — G4700 Insomnia, unspecified: Secondary | ICD-10-CM | POA: Diagnosis not present

## 2011-09-23 MED ORDER — ZOLPIDEM TARTRATE ER 12.5 MG PO TBCR: 12.5000 mg | EXTENDED_RELEASE_TABLET | Freq: Every evening | ORAL | Status: DC | PRN

## 2011-09-23 NOTE — Progress Notes (Signed)
S: Pt is in office today for 6 month follow up on migraines. She is down to having one severe per month which is controlled with Amerge. The Keppra is working well for her as prevention. Today it is noted that her pulse is in the 50's, and she is on Atenolol 100mg . She denies any fatigue. She is stressed with caring for grandchildren 3-4 days per week. She is doing well on Ambien CR, though there are nights she only gets 6 hours of sleep.   O: Alert, oriented, NAD Cardiac: bradycardia Lungs: clear Skin: warm and dry  A: Migraine Insomnia  P: We talked about her taking 1/2 tablet of Atenolol and keeping track of BP and pulse for several weeks and calling me with that report. She would like to remain on Keppra and have her Ambien refilled. She will RTC one year or prn

## 2011-09-23 NOTE — Patient Instructions (Signed)

## 2012-02-28 ENCOUNTER — Ambulatory Visit (INDEPENDENT_AMBULATORY_CARE_PROVIDER_SITE_OTHER): Payer: Medicare Other | Admitting: Family Medicine

## 2012-02-28 VITALS — BP 164/76 | HR 93 | Temp 98.0°F | Resp 18 | Ht 60.0 in | Wt 170.0 lb

## 2012-02-28 DIAGNOSIS — R05 Cough: Secondary | ICD-10-CM

## 2012-02-28 DIAGNOSIS — J309 Allergic rhinitis, unspecified: Secondary | ICD-10-CM | POA: Diagnosis not present

## 2012-02-28 MED ORDER — CETIRIZINE HCL 10 MG PO TABS: 10.0000 mg | ORAL_TABLET | Freq: Every day | ORAL | Status: DC

## 2012-02-28 MED ORDER — AZITHROMYCIN 250 MG PO TABS: ORAL_TABLET | ORAL | Status: AC

## 2012-02-28 MED ORDER — HYDROCODONE-HOMATROPINE 5-1.5 MG/5ML PO SYRP: 5.0000 mL | ORAL_SOLUTION | Freq: Three times a day (TID) | ORAL | Status: AC | PRN

## 2012-02-28 NOTE — Patient Instructions (Addendum)
1. Allergic rhinitis  cetirizine (ZYRTEC) 10 MG tablet  2. Cough  HYDROcodone-homatropine (HYCODAN) 5-1.5 MG/5ML syrup, azithromycin (ZITHROMAX Z-PAK) 250 MG tablet    Allergic Rhinitis Allergic rhinitis is when the mucous membranes in the nose respond to allergens. Allergens are particles in the air that cause your body to have an allergic reaction. This causes you to release allergic antibodies. Through a chain of events, these eventually cause you to release histamine into the blood stream (hence the use of antihistamines). Although meant to be protective to the body, it is this release that causes your discomfort, such as frequent sneezing, congestion and an itchy runny nose.  CAUSES  The pollen allergens may come from grasses, trees, and weeds. This is seasonal allergic rhinitis, or "hay fever." Other allergens cause year-round allergic rhinitis (perennial allergic rhinitis) such as house dust mite allergen, pet dander and mold spores.  SYMPTOMS   Nasal stuffiness (congestion).   Runny, itchy nose with sneezing and tearing of the eyes.   There is often an itching of the mouth, eyes and ears.  It cannot be cured, but it can be controlled with medications. DIAGNOSIS  If you are unable to determine the offending allergen, skin or blood testing may find it. TREATMENT   Avoid the allergen.   Medications and allergy shots (immunotherapy) can help.   Hay fever may often be treated with antihistamines in pill or nasal spray forms. Antihistamines block the effects of histamine. There are over-the-counter medicines that may help with nasal congestion and swelling around the eyes. Check with your caregiver before taking or giving this medicine.  If the treatment above does not work, there are many new medications your caregiver can prescribe. Stronger medications may be used if initial measures are ineffective. Desensitizing injections can be used if medications and avoidance fails.  Desensitization is when a patient is given ongoing shots until the body becomes less sensitive to the allergen. Make sure you follow up with your caregiver if problems continue. SEEK MEDICAL CARE IF:   You develop fever (more than 100.5 F (38.1 C).   You develop a cough that does not stop easily (persistent).   You have shortness of breath.   You start wheezing.   Symptoms interfere with normal daily activities.  Document Released: 02/25/2001 Document Revised: 05/22/2011 Document Reviewed: 09/06/2008 [facility] Patient Information 2012 [place], [place].

## 2012-02-28 NOTE — Progress Notes (Signed)
Subjective:    Patient ID: Sheri Soto, female    DOB: 08-27-1945, 66 y.o.   MRN: [id number]  HPI This 66 y.o. female presents for evaluation of sore throat, cough.  Onset four days ago.  Cough constantly at night.  Awoke at 2:00am with hacky cough.  Burning in chest.  No fever/chills/sweats. Mild headache.  No ear pain.  Throat is scratchy; mild pain with swallowing.  +rhinorrhea.  No nasal congestion.  +sneezing a lot.  +PND.  +coughing a lot; using cough lozenges; coughing during the day but worse at night and at bedtime.  No sputum production; no DOE with ambulation.  No n/v/d.  No rash.  Watch two grandchildren and one with rhinorrhea, sneezing, coughing.  No history of seasonal allergies.  No itchy eyes or nose.   No history of asthma or tobacco abuse.  Last night when coughing so badly, took some of husband's cough syrup; medication seemed to help for four hours.  Hycodan syrup.   PMH:  Migraines.   Psurg: kidney stone retraction 2012 B.   All:  NKDA Social: no tobacco; retired.    Review of Systems  Constitutional: Negative for fever, chills, diaphoresis and fatigue.  HENT: Positive for congestion, sore throat, rhinorrhea, sneezing, voice change and postnasal drip. Negative for mouth sores and trouble swallowing.   Respiratory: Positive for cough. Negative for shortness of breath, wheezing and stridor.   Gastrointestinal: Negative for nausea, vomiting and diarrhea.    Past Medical History  Diagnosis Date  . Anxiety   . Depression   . Migraine   . Abnormal Pap smear     5 YEARS AGO  . Personal history of kidney stones     Past Surgical History  Procedure Date  . Tubal ligation     29 YEARS AGO  . Ectopic pregnancy surgery 1977  . Tonsillectomy   . Gallbladder surgery     REMOVED  . Laparoscopic nephrectomy     Prior to Admission medications   Medication Sig Start Date End Date Taking? Authorizing Provider  atenolol (TENORMIN) 100 MG tablet Take 1 tablet (100 mg  total) by mouth daily. 05/22/11  Yes Carolynn Serve, NP  FLUVIRIN INJ injection  03/03/11  Yes Historical Provider, MD  naratriptan (AMERGE) 2.5 MG tablet Take 2.5 mg by mouth as needed. Take one (1) tablet at onset of headache; if returns or does not resolve, may repeat after 4 hours; do not exceed five (5) mg in 24 hours.    Yes Historical Provider, MD  zolpidem (AMBIEN CR) 12.5 MG CR tablet Take 1 tablet (12.5 mg total) by mouth at bedtime as needed. 09/23/11  Yes Carolynn Serve, NP  ALPRAZolam Prudy Feeler) 0.25 MG tablet Take 0.25 mg by mouth at bedtime as needed.      Historical Provider, MD  azithromycin (ZITHROMAX Z-PAK) 250 MG tablet Two tablets daily x 1 day, then one tablet daily x 4 days 02/28/12 03/04/12  Ethelda Chick, MD  cetirizine (ZYRTEC) 10 MG tablet Take 1 tablet (10 mg total) by mouth daily. 02/28/12 02/27/13  Ethelda Chick, MD  HYDROcodone-homatropine (HYCODAN) 5-1.5 MG/5ML syrup Take 5 mLs by mouth every 8 (eight) hours as needed for cough. 02/28/12 03/09/12  Ethelda Chick, MD  levETIRAcetam (KEPPRA XR) 500 MG 24 hr tablet Take 1 tablet (500 mg total) by mouth daily. 02/04/11 02/04/12  Carolynn Serve, NP    No Known Allergies  History   Social History  .  Marital Status: Married    Spouse Name: N/A    Number of Children: N/A  . Years of Education: N/A   Occupational History  . Not on file.   Social History Main Topics  . Smoking status: Never Smoker   . Smokeless tobacco: Not on file  . Alcohol Use: No  . Drug Use: No  . Sexually Active: Yes -- Female partner(s)   Other Topics Concern  . Not on file   Social History Narrative  . No narrative on file    Family History  Problem Relation Age of Onset  . Heart disease Father     MASSIVE HEART ATTACK  . Hypertension Father   . Cancer Father     colon cancer  . Heart attack Father   . Heart disease Mother   . Hypertension Mother   . Clotting disorder Mother   . Cancer Mother     liver and colon        Objective:   Physical Exam  Nursing note and vitals reviewed. Constitutional: She is oriented to person, place, and time. She appears well-developed and well-nourished. No distress.  HENT:  Head: Normocephalic and atraumatic.  Right Ear: External ear normal.  Left Ear: External ear normal.  Nose: Nose normal.  Mouth/Throat: Oropharynx is clear and moist. No oropharyngeal exudate.  Eyes: Conjunctivae normal and EOM are normal. Pupils are equal, round, and reactive to light.  Neck: Normal range of motion. Neck supple.  Cardiovascular: Normal rate, regular rhythm, normal heart sounds and intact distal pulses.   No murmur heard. Pulmonary/Chest: Breath sounds normal. She is in respiratory distress. She has no wheezes. She has no rales.  Lymphadenopathy:    She has no cervical adenopathy.  Neurological: She is alert and oriented to person, place, and time.  Skin: Skin is warm and dry. She is not diaphoretic.  Psychiatric: She has a normal mood and affect. Her behavior is normal. Judgment and thought content normal.       Assessment & Plan:   1. Allergic rhinitis  cetirizine (ZYRTEC) 10 MG tablet  2. Cough  HYDROcodone-homatropine (HYCODAN) 5-1.5 MG/5ML syrup, azithromycin (ZITHROMAX Z-PAK) 250 MG tablet     1.  Allergic Rhinitis: New.  Rx for Zyrtec 10mg  daily sent to [facility].  Pt declined Flonase rx.   2.  Cough: New. Secondary to allergic rhinitis.  Rx for Zyrtec provided; rx for Hycodan also provided. If no improvement in 72 hours, to start Zpack for bronchitis.  RTC for development of fever, acute worsening, SOB.

## 2012-03-02 NOTE — Progress Notes (Signed)
Reviewed and agree.

## 2012-03-03 DIAGNOSIS — Z23 Encounter for immunization: Secondary | ICD-10-CM | POA: Diagnosis not present

## 2012-04-12 ENCOUNTER — Telehealth: Payer: Self-pay | Admitting: Gynecology

## 2012-04-12 DIAGNOSIS — G4700 Insomnia, unspecified: Secondary | ICD-10-CM

## 2012-04-12 MED ORDER — ALPRAZOLAM 0.25 MG PO TABS: 0.2500 mg | ORAL_TABLET | Freq: Every evening | ORAL | Status: DC | PRN

## 2012-04-12 NOTE — Telephone Encounter (Addendum)
Patient call regarding refills on her xanax 0.25mg . Per patient call pharmacy and refill was not there. Patient stated that she had seen our headache specialist Jannifer Rodney and had request refill. Refill call in to the [facility] in [place]. #30 with 2 refill.

## 2012-05-17 ENCOUNTER — Telehealth: Payer: Self-pay | Admitting: *Deleted

## 2012-05-17 DIAGNOSIS — G4700 Insomnia, unspecified: Secondary | ICD-10-CM

## 2012-05-17 MED ORDER — ZOLPIDEM TARTRATE ER 12.5 MG PO TBCR: 12.5000 mg | EXTENDED_RELEASE_TABLET | Freq: Every evening | ORAL | Status: DC | PRN

## 2012-05-17 NOTE — Telephone Encounter (Signed)
Patient needs refill of her ambien cr.  Authorized refills.

## 2012-07-11 ENCOUNTER — Ambulatory Visit: Payer: Medicare Other

## 2012-07-11 ENCOUNTER — Ambulatory Visit (INDEPENDENT_AMBULATORY_CARE_PROVIDER_SITE_OTHER): Payer: Medicare Other | Admitting: Internal Medicine

## 2012-07-11 VITALS — BP 137/91 | HR 105 | Temp 98.4°F | Resp 16 | Ht 61.4 in | Wt 174.6 lb

## 2012-07-11 DIAGNOSIS — R05 Cough: Secondary | ICD-10-CM

## 2012-07-11 DIAGNOSIS — R5383 Other fatigue: Secondary | ICD-10-CM | POA: Diagnosis not present

## 2012-07-11 DIAGNOSIS — G4700 Insomnia, unspecified: Secondary | ICD-10-CM

## 2012-07-11 DIAGNOSIS — D72829 Elevated white blood cell count, unspecified: Secondary | ICD-10-CM | POA: Diagnosis not present

## 2012-07-11 DIAGNOSIS — R5381 Other malaise: Secondary | ICD-10-CM

## 2012-07-11 LAB — POCT CBC
Granulocyte percent: 78.2 %G (ref 37–80)
MCV: 92.7 fL (ref 80–97)
MID (cbc): 0.8 (ref 0–0.9)
MPV: 11.6 fL (ref 0–99.8)
POC MID %: 5 %M (ref 0–12)
Platelet Count, POC: 290 10*3/uL (ref 142–424)
RBC: 5.62 M/uL — AB (ref 4.04–5.48)

## 2012-07-11 MED ORDER — AZITHROMYCIN 500 MG PO TABS: 500.0000 mg | ORAL_TABLET | Freq: Every day | ORAL | Status: DC

## 2012-07-11 MED ORDER — HYDROCOD POLST-CHLORPHEN POLST 10-8 MG/5ML PO LQCR: 5.0000 mL | Freq: Two times a day (BID) | ORAL | Status: DC | PRN

## 2012-07-11 NOTE — Patient Instructions (Addendum)

## 2012-07-11 NOTE — Progress Notes (Signed)
  Subjective:    Patient ID: Sheri Soto, female    DOB: [birth year], 67 y.o.   MRN: [id number]  HPI 3 weeks of persistent cough, head congestion, fatigue, maybe longer. Sputum not dark, no fever, sob, cp.   Review of Systems migrain on keppra    Objective:   Physical Exam  Vitals reviewed. Constitutional: She is oriented to person, place, and time. She appears well-nourished.  HENT:  Right Ear: External ear normal.  Left Ear: External ear normal.  Nose: Mucosal edema and rhinorrhea present. Right sinus exhibits no maxillary sinus tenderness and no frontal sinus tenderness. Left sinus exhibits no maxillary sinus tenderness and no frontal sinus tenderness.  Mouth/Throat: Oropharynx is clear and moist.  Cardiovascular: Normal rate and regular rhythm.   Pulmonary/Chest: Effort normal. Not tachypneic. No respiratory distress. She has no decreased breath sounds. She has no wheezes. She has rhonchi. She has no rales.  Neurological: She is alert and oriented to person, place, and time. She exhibits normal muscle tone. Coordination normal.  Skin: No rash noted.  Psychiatric: She has a normal mood and affect.   [facility] reading (PRIMARY) by  Dr.Myah Guynes CXR normal  Results for orders placed in visit on 07/11/12  POCT CBC      Component Value Range   WBC 16.0 (*) 4.6 - 10.2 K/uL   Lymph, poc 2.7  0.6 - 3.4   POC LYMPH PERCENT 16.8  10 - 50 %L   MID (cbc) 0.8  0 - 0.9   POC MID % 5.0  0 - 12 %M   POC Granulocyte 12.5 (*) 2 - 6.9   Granulocyte percent 78.2  37 - 80 %G   RBC 5.62 (*) 4.04 - 5.48 M/uL   Hemoglobin 16.7 (*) 12.2 - 16.2 g/dL   HCT, POC 40.9 (*) 81.1 - 47.9 %   MCV 92.7  80 - 97 fL   MCH, POC 29.7  27 - 31.2 pg   MCHC 32.1  31.8 - 35.4 g/dL   RDW, POC 91.4     Platelet Count, POC 290  142 - 424 K/uL   MPV 11.6  0 - 99.8 fL          Assessment & Plan:  Cough/head congestion Leukocytosis/Elevated H and H Zithromax 500mg  Repeat cbc 1-2 weeks

## 2012-08-31 ENCOUNTER — Encounter: Payer: Self-pay | Admitting: Nurse Practitioner

## 2012-08-31 ENCOUNTER — Ambulatory Visit (INDEPENDENT_AMBULATORY_CARE_PROVIDER_SITE_OTHER): Payer: Medicare Other | Admitting: Nurse Practitioner

## 2012-08-31 VITALS — BP 163/97 | HR 88 | Ht 62.0 in | Wt 176.0 lb

## 2012-08-31 MED ORDER — ATENOLOL 100 MG PO TABS: 100.0000 mg | ORAL_TABLET | Freq: Every day | ORAL | Status: DC

## 2012-08-31 MED ORDER — NARATRIPTAN HCL 2.5 MG PO TABS: 2.5000 mg | ORAL_TABLET | ORAL | Status: DC | PRN

## 2012-08-31 MED ORDER — ZOLPIDEM TARTRATE ER 12.5 MG PO TBCR: 12.5000 mg | EXTENDED_RELEASE_TABLET | Freq: Every evening | ORAL | Status: DC | PRN

## 2012-08-31 MED ORDER — LEVETIRACETAM ER 500 MG PO TB24: 500.0000 mg | ORAL_TABLET | Freq: Every day | ORAL | Status: DC

## 2012-08-31 MED ORDER — ALPRAZOLAM 0.25 MG PO TABS: 0.2500 mg | ORAL_TABLET | Freq: Every evening | ORAL | Status: DC | PRN

## 2012-08-31 NOTE — Patient Instructions (Signed)
Migraine Headache A migraine headache is an intense, throbbing pain on one or both sides of your head. A migraine can last for 30 minutes to several hours. CAUSES  The exact cause of a migraine headache is not always known. However, a migraine may be caused when nerves in the brain become irritated and release chemicals that cause inflammation. This causes pain. SYMPTOMS  Pain on one or both sides of your head.  Pulsating or throbbing pain.  Severe pain that prevents daily activities.  Pain that is aggravated by any physical activity.  Nausea, vomiting, or both.  Dizziness.  Pain with exposure to bright lights, loud noises, or activity.  General sensitivity to bright lights, loud noises, or smells. Before you get a migraine, you may get warning signs that a migraine is coming (aura). An aura may include:  Seeing flashing lights.  Seeing bright spots, halos, or zig-zag lines.  Having tunnel vision or blurred vision.  Having feelings of numbness or tingling.  Having trouble talking.  Having muscle weakness. MIGRAINE TRIGGERS  Alcohol.  Smoking.  Stress.  Menstruation.  Aged cheeses.  Foods or drinks that contain nitrates, glutamate, aspartame, or tyramine.  Lack of sleep.  Chocolate.  Caffeine.  Hunger.  Physical exertion.  Fatigue.  Medicines used to treat chest pain (nitroglycerine), birth control pills, estrogen, and some blood pressure medicines. DIAGNOSIS  A migraine headache is often diagnosed based on:  Symptoms.  Physical examination.  A CT scan or MRI of your head. TREATMENT Medicines may be given for pain and nausea. Medicines can also be given to help prevent recurrent migraines.  HOME CARE INSTRUCTIONS  Only take over-the-counter or prescription medicines for pain or discomfort as directed by your caregiver. The use of long-term narcotics is not recommended.  Lie down in a dark, quiet room when you have a migraine.  Keep a journal  to find out what may trigger your migraine headaches. For example, write down:  What you eat and drink.  How much sleep you get.  Any change to your diet or medicines.  Limit alcohol consumption.  Quit smoking if you smoke.  Get 7 to 9 hours of sleep, or as recommended by your caregiver.  Limit stress.  Keep lights dim if bright lights bother you and make your migraines worse. SEEK IMMEDIATE MEDICAL CARE IF:   Your migraine becomes severe.  You have a fever.  You have a stiff neck.  You have vision loss.  You have muscular weakness or loss of muscle control.  You start losing your balance or have trouble walking.  You feel faint or pass out.  You have severe symptoms that are different from your first symptoms. MAKE SURE YOU:   Understand these instructions.  Will watch your condition.  Will get help right away if you are not doing well or get worse. Document Released: 06/02/2005 Document Revised: 08/25/2011 Document Reviewed: 05/23/2011 [employer] Patient Information 2013 [employer].  

## 2012-08-31 NOTE — Progress Notes (Signed)
S: Pt returns today for follow up on migraines. She is doing very well with migraines, in fact has not had a migraine in 5 months. She does not want to start tapering down her preventives. Her BP today is elevated. We had dropped her atenolol down to 50 mg. When asked today who her PCP was she stated she did not have any other health care provider. She has not had mammogram, pap smear, or any preventive studies done in about 10 years. She admits this is not due to a lack of insurance or fear of doctors, she just "has not done it".   O: General, alert oriented NAD Cardiac RRR Lungs clear Skin warm and dry  A: Migraine Elevated BP today Insomnia  P: Will refill migraine, insomnia meds Advised to get PCP ASAP Will go back up to 100mg  on Atenolol Pt agreed to RTC for pap

## 2012-09-10 ENCOUNTER — Other Ambulatory Visit: Payer: Self-pay

## 2012-09-10 DIAGNOSIS — Z1231 Encounter for screening mammogram for malignant neoplasm of breast: Secondary | ICD-10-CM

## 2012-09-21 ENCOUNTER — Ambulatory Visit (INDEPENDENT_AMBULATORY_CARE_PROVIDER_SITE_OTHER): Payer: Medicare Other | Admitting: Nurse Practitioner

## 2012-09-21 ENCOUNTER — Encounter: Payer: Self-pay | Admitting: Nurse Practitioner

## 2012-09-21 ENCOUNTER — Other Ambulatory Visit (HOSPITAL_COMMUNITY)
Admission: RE | Admit: 2012-09-21 | Discharge: 2012-09-21 | Disposition: A | Discharge status: Home / Self Care | Payer: Medicare Other | Source: Ambulatory Visit | Attending: Nurse Practitioner | Admitting: Nurse Practitioner

## 2012-09-21 VITALS — BP 159/106 | HR 108 | Ht 60.0 in | Wt 175.0 lb

## 2012-09-21 DIAGNOSIS — Z1151 Encounter for screening for human papillomavirus (HPV): Secondary | ICD-10-CM | POA: Insufficient documentation

## 2012-09-21 DIAGNOSIS — E669 Obesity, unspecified: Secondary | ICD-10-CM

## 2012-09-21 DIAGNOSIS — Z124 Encounter for screening for malignant neoplasm of cervix: Secondary | ICD-10-CM | POA: Diagnosis not present

## 2012-09-21 DIAGNOSIS — Z862 Personal history of diseases of the blood and blood-forming organs and certain disorders involving the immune mechanism: Secondary | ICD-10-CM

## 2012-09-21 DIAGNOSIS — Z1329 Encounter for screening for other suspected endocrine disorder: Secondary | ICD-10-CM | POA: Diagnosis not present

## 2012-09-21 DIAGNOSIS — I10 Essential (primary) hypertension: Secondary | ICD-10-CM | POA: Insufficient documentation

## 2012-09-21 DIAGNOSIS — Z01419 Encounter for gynecological examination (general) (routine) without abnormal findings: Secondary | ICD-10-CM | POA: Diagnosis not present

## 2012-09-21 LAB — COMPREHENSIVE METABOLIC PANEL
Alkaline Phosphatase: 90 U/L (ref 39–117)
BUN: 11 mg/dL (ref 6–23)
Glucose, Bld: 124 mg/dL — ABNORMAL HIGH (ref 70–99)
Sodium: 139 mEq/L (ref 135–145)
Total Bilirubin: 0.8 mg/dL (ref 0.3–1.2)

## 2012-09-21 LAB — CBC WITH DIFFERENTIAL/PLATELET
Eosinophils Absolute: 0 10*3/uL (ref 0.0–0.7)
Eosinophils Relative: 0 % (ref 0–5)
HCT: 47.4 % — ABNORMAL HIGH (ref 36.0–46.0)
Hemoglobin: 16 g/dL — ABNORMAL HIGH (ref 12.0–15.0)
Lymphs Abs: 1.8 10*3/uL (ref 0.7–4.0)
MCH: 30 pg (ref 26.0–34.0)
MCV: 88.9 fL (ref 78.0–100.0)
Monocytes Relative: 5 % (ref 3–12)
RBC: 5.33 MIL/uL — ABNORMAL HIGH (ref 3.87–5.11)

## 2012-09-21 LAB — LIPID PANEL
HDL: 48 mg/dL (ref >39)
LDL Cholesterol: 162 mg/dL — ABNORMAL HIGH (ref 0–99)
Total CHOL/HDL Ratio: 4.8 Ratio

## 2012-09-21 LAB — TSH: TSH: 1.309 u[IU]/mL (ref 0.350–4.500)

## 2012-09-21 NOTE — Patient Instructions (Signed)

## 2012-09-21 NOTE — Progress Notes (Signed)
Here today for gyn physical and pap smear. No other concerns. Will need refill of her Amerge and she is now using a mail order pharmacy.

## 2012-09-21 NOTE — Progress Notes (Signed)
Subjective:     Patient ID: Sheri Soto, female   DOB: February 18, 1946, 68 y.o.   MRN: [id number]  HPI Pt is in office today for her pap and physical exam. She has not had an annual exam in possibly more than 10 years. She has not established a primary care MD. She has not had a mammogram since 2006. At her last migraine visit it was noted that her BP was elevated and we went up on her Atenolol to 100 mg. Today her BP was 159/106 and repeat was 155/104. She does complain of urinary incontinence daily and wears a pad daily   Review of Systems  Constitutional: Negative for fever, chills, diaphoresis, activity change, appetite change, fatigue and unexpected weight change.  HENT: Negative.   Eyes: Negative.   Respiratory: Negative.   Cardiovascular: Negative.   Gastrointestinal: Negative.   Endocrine: Negative.   Genitourinary: Negative.        Leaks urine daily  Allergic/Immunologic: Negative.   Neurological: Negative.        Has migraine headaches  Hematological: Negative.   Psychiatric/Behavioral: Negative.        Objective:   Physical Exam  Constitutional: She appears well-developed and well-nourished. No distress.  Obese  HENT:  Head: Normocephalic and atraumatic.  Eyes: Pupils are equal, round, and reactive to light.  Neck: Normal range of motion. Neck supple. No thyromegaly present.  Cardiovascular: Normal rate, regular rhythm and normal heart sounds.  Exam reveals no gallop and no friction rub.   No murmur heard. Pulmonary/Chest: Effort normal and breath sounds normal. No respiratory distress. She has no wheezes. She has no rales. She exhibits no tenderness.  Abdominal: Soft. Bowel sounds are normal. She exhibits no distension and no mass. There is no tenderness. There is no rebound and no guarding.  Genitourinary: Vagina normal and uterus normal. No vaginal discharge found.  Cystocele  Lymphadenopathy:    She has no cervical adenopathy.  Skin: She is not diaphoretic.        Assessment:     Yearly Exam and Pap Smear Hypertension Migraine Cystocele     Plan:     Will do Pap Smear, Mammogram, CBC, TSH, Lipid Panel ( she is fasting) , CMET Advised to get Primary Care MD ASAP/ she has chosen [facility].  Advised to get Colonoscopy ASAP She is given information on Cystocele and she will make an appointment with OBGYN

## 2012-10-05 ENCOUNTER — Telehealth: Payer: Self-pay | Admitting: Nurse Practitioner

## 2012-10-05 MED ORDER — NARATRIPTAN HCL 2.5 MG PO TABS: 2.5000 mg | ORAL_TABLET | ORAL | Status: DC | PRN

## 2012-10-05 NOTE — Telephone Encounter (Signed)
Pt labs reviewed and she has agreed to see PCP. She asked for Amerge to be sent in again as [employer] say it is not there.

## 2012-10-07 ENCOUNTER — Ambulatory Visit
Admission: RE | Admit: 2012-10-07 | Discharge: 2012-10-07 | Disposition: A | Discharge status: Home / Self Care | Payer: Medicare Other | Source: Ambulatory Visit

## 2012-10-07 DIAGNOSIS — Z1231 Encounter for screening mammogram for malignant neoplasm of breast: Secondary | ICD-10-CM | POA: Diagnosis not present

## 2012-10-11 ENCOUNTER — Other Ambulatory Visit: Payer: Self-pay | Admitting: Nurse Practitioner

## 2012-10-11 DIAGNOSIS — R928 Other abnormal and inconclusive findings on diagnostic imaging of breast: Secondary | ICD-10-CM

## 2012-10-20 ENCOUNTER — Telehealth: Payer: Self-pay | Admitting: *Deleted

## 2012-10-20 NOTE — Telephone Encounter (Signed)
Called patient and left her a message to call us back regarding her breast ultrasound that was missed on the 28th.

## 2012-10-21 ENCOUNTER — Ambulatory Visit
Admission: RE | Admit: 2012-10-21 | Discharge: 2012-10-21 | Disposition: A | Discharge status: Home / Self Care | Payer: Medicare Other | Source: Ambulatory Visit | Attending: Nurse Practitioner | Admitting: Nurse Practitioner

## 2012-10-21 DIAGNOSIS — R928 Other abnormal and inconclusive findings on diagnostic imaging of breast: Secondary | ICD-10-CM | POA: Diagnosis not present

## 2013-01-05 ENCOUNTER — Telehealth: Payer: Self-pay | Admitting: *Deleted

## 2013-01-05 DIAGNOSIS — G4700 Insomnia, unspecified: Secondary | ICD-10-CM

## 2013-01-05 MED ORDER — ZOLPIDEM TARTRATE ER 12.5 MG PO TBCR: 12.5000 mg | EXTENDED_RELEASE_TABLET | Freq: Every evening | ORAL | Status: DC | PRN

## 2013-01-05 NOTE — Telephone Encounter (Signed)
Patient is requesting refill of her Ambien.

## 2013-02-28 DIAGNOSIS — Z23 Encounter for immunization: Secondary | ICD-10-CM | POA: Diagnosis not present

## 2013-05-04 ENCOUNTER — Other Ambulatory Visit: Payer: Self-pay | Admitting: Nurse Practitioner

## 2013-05-17 ENCOUNTER — Telehealth: Payer: Self-pay | Admitting: *Deleted

## 2013-05-17 NOTE — Telephone Encounter (Signed)
Patient called to let us know that her prior authorization will expire this month and has asked that we go ahead and authorize a new prior authorization for her Ambien.

## 2013-05-24 ENCOUNTER — Telehealth: Payer: Self-pay | Admitting: *Deleted

## 2013-05-24 NOTE — Telephone Encounter (Signed)
Patient is calling to confirm her prior authorization for her medication Ambien Cr.  I spoke with Herbert Seta at [facility] [phone] opt 2 and opt 0.  She has authorized the patients medication from May 19, 2013 through May 19, 2014.

## 2013-08-11 ENCOUNTER — Other Ambulatory Visit: Payer: Self-pay | Admitting: Nurse Practitioner

## 2013-08-17 ENCOUNTER — Other Ambulatory Visit: Payer: Self-pay | Admitting: *Deleted

## 2013-08-17 DIAGNOSIS — G4700 Insomnia, unspecified: Secondary | ICD-10-CM

## 2013-08-17 MED ORDER — ZOLPIDEM TARTRATE ER 12.5 MG PO TBCR: 12.5000 mg | EXTENDED_RELEASE_TABLET | Freq: Every evening | ORAL | Status: DC | PRN

## 2013-08-17 NOTE — Telephone Encounter (Signed)
Pt called needing refills on her Ambien.  I spoke with Monna Fam and she authorized 5 additional refills.  I have sent in.  Pt aware.

## 2014-01-02 ENCOUNTER — Other Ambulatory Visit: Payer: Self-pay | Admitting: Nurse Practitioner

## 2014-02-21 ENCOUNTER — Other Ambulatory Visit: Payer: Self-pay | Admitting: *Deleted

## 2014-02-21 NOTE — Telephone Encounter (Signed)
Patient needs refills on Ambien CR 12.5 mg tablet.  I have called in rx.  Pt aware.

## 2014-03-03 ENCOUNTER — Encounter: Payer: Medicare Other | Admitting: Nurse Practitioner

## 2014-03-07 ENCOUNTER — Encounter: Payer: Medicare Other | Admitting: Nurse Practitioner

## 2014-03-14 ENCOUNTER — Encounter: Payer: Self-pay | Admitting: Nurse Practitioner

## 2014-03-14 ENCOUNTER — Ambulatory Visit (INDEPENDENT_AMBULATORY_CARE_PROVIDER_SITE_OTHER): Payer: Medicare Other | Admitting: Nurse Practitioner

## 2014-03-14 VITALS — BP 167/100 | HR 100 | Ht 61.0 in | Wt 177.8 lb

## 2014-03-14 DIAGNOSIS — F411 Generalized anxiety disorder: Secondary | ICD-10-CM

## 2014-03-14 DIAGNOSIS — G4700 Insomnia, unspecified: Secondary | ICD-10-CM

## 2014-03-14 DIAGNOSIS — E669 Obesity, unspecified: Secondary | ICD-10-CM

## 2014-03-14 DIAGNOSIS — I10 Essential (primary) hypertension: Secondary | ICD-10-CM

## 2014-03-14 DIAGNOSIS — G43009 Migraine without aura, not intractable, without status migrainosus: Secondary | ICD-10-CM | POA: Diagnosis not present

## 2014-03-14 DIAGNOSIS — F419 Anxiety disorder, unspecified: Secondary | ICD-10-CM

## 2014-03-14 DIAGNOSIS — E7800 Pure hypercholesterolemia, unspecified: Secondary | ICD-10-CM | POA: Diagnosis not present

## 2014-03-14 MED ORDER — LEVETIRACETAM ER 500 MG PO TB24: 500.0000 mg | ORAL_TABLET | Freq: Every day | ORAL | Status: DC

## 2014-03-14 NOTE — Progress Notes (Signed)
History:  Sheri Soto a 68 y.o. No obstetric history on file. who presents to [facility] clinic today for follow up with migraine headaches. Since our last visit one year ago she is doing better with her migraines. She has only had one "bad" one but now is having a daily mild one. This maybe due to running out of her Keppra about 4-5 months ago and not refilling it. She was asked to find a PCP last year to manage her hypertension, elevated cholesterol, elevated blood sugars and obesity. She did not do that. Her Triptan will not be refilled. She would like to try to wean off the Ambien. She did not increase the dose of Atenolol.   The following portions of the patient's history were reviewed and updated as appropriate: allergies, current medications, past family history, past medical history, past social history, past surgical history and problem list.  Review of Systems:    Objective:  Physical Exam BP 167/100  Pulse 100  Ht 5\' 1"  (1.549 m)  Wt 177 lb 12.8 oz (80.65 kg)  BMI 33.61 kg/m2 GENERAL: Well-developed, well-nourished female in no acute distress. Obese HEENT: Normocephalic, atraumatic.  NECK: Supple. Normal thyroid.  LUNGS: Normal rate. Clear to auscultation bilaterally.  HEART: Regular rate and rhythm with no adventitious sounds.   EXTREMITIES: No cyanosis, clubbing, or edema, 2+ distal pulses.   Labs and Imaging No results found.  Assessment & Plan:  Assessment:  Migraine without Aura HTN Elevated Cholesterol Obesity Insomnia Anxiety  Plans:  Due to increased risk of cardiac event patient again is strongly advised to find PCP and have blood pressure and cholesterol treated Refill Keppra and advised to restart for daily headaches Refill Xanax 0.25 mg # 30/ 3 refills and may use for anxiety/ insomnia Vicoden 5/325mg  # 20 po q6 hours to be used for severe migraine as Amerge will not be refilled RTC one year or prn  Olegario Messier, NP 03/14/2014 3:28 PM

## 2014-03-14 NOTE — Patient Instructions (Signed)

## 2014-03-16 DIAGNOSIS — L739 Follicular disorder, unspecified: Secondary | ICD-10-CM | POA: Diagnosis not present

## 2014-03-16 DIAGNOSIS — D1801 Hemangioma of skin and subcutaneous tissue: Secondary | ICD-10-CM | POA: Diagnosis not present

## 2014-03-16 DIAGNOSIS — L821 Other seborrheic keratosis: Secondary | ICD-10-CM | POA: Diagnosis not present

## 2014-03-16 DIAGNOSIS — L720 Epidermal cyst: Secondary | ICD-10-CM | POA: Diagnosis not present

## 2014-03-30 ENCOUNTER — Ambulatory Visit (INDEPENDENT_AMBULATORY_CARE_PROVIDER_SITE_OTHER): Payer: Medicare Other | Admitting: Podiatrist

## 2014-03-30 ENCOUNTER — Encounter: Payer: Self-pay | Admitting: Podiatrist

## 2014-03-30 VITALS — Ht 61.0 in | Wt 177.0 lb

## 2014-03-30 DIAGNOSIS — B351 Tinea unguium: Secondary | ICD-10-CM

## 2014-03-30 DIAGNOSIS — M79676 Pain in unspecified toe(s): Secondary | ICD-10-CM

## 2014-03-30 NOTE — Progress Notes (Signed)
   Subjective:    Patient ID: Sheri Soto, female    DOB: 04/13/1946, 68 y.o.   MRN: [id number]  HPI Comments: Pt requests trimming of right 1-5 toenails that are uncomfortable, elongated and thick.     Review of Systems  Psychiatric/Behavioral: The patient is nervous/anxious.   All other systems reviewed and are negative.      Objective:   Physical Exam GENERAL APPEARANCE: Alert, conversant. Appropriately groomed. No acute distress.  VASCULAR: Pedal pulses palpable at 1/4 DP and PT bilateral.  Capillary refill time is immediate to all digits,  Proximal to distal cooling it warm to warm.   NEUROLOGIC: sensation is intact epicritically and protectively to 5.07 monofilament at 5/5 sites bilateral.  Light touch is intact bilateral, vibratory sensation intact bilateral, achilles tendon reflex is intact bilateral.  MUSCULOSKELETAL: acceptable muscle strength, tone and stability bilateral.  Intrinsic muscluature intact bilateral.  Rectus appearance of foot and digits noted bilateral.   DERMATOLOGIC: skin color, texture, and turger are within normal limits.  Digital nails are elongated, thickened, discolored, dystrophic and clinically mycotic x10. They are also painful with debridement.    Assessment & Plan:  Symptomatic and mycotic toenails x10  Plan: Debridement of toenails was carried out today without complication. She'll be seen back in 3 months or as needed for followup and will call if any concerns arise.

## 2014-05-17 ENCOUNTER — Telehealth: Payer: Self-pay | Admitting: *Deleted

## 2014-05-17 NOTE — Telephone Encounter (Signed)
I called and informed the patient that she doesn't have fungus per Dr. Valentina Lucks however, there is a Candida Species (Yeast) present.  She said if you would like she can prescribe you Diflucan.  You would have to take it once a week for up to one year.  I asked her if she would like a prescription.  "Yes, I'd like to do that."  I told her that Dr. Valentina Lucks would send in a prescription on tomorrow.

## 2014-05-18 ENCOUNTER — Other Ambulatory Visit: Payer: Self-pay | Admitting: Podiatrist

## 2014-05-18 MED ORDER — FLUCONAZOLE 150 MG PO TABS: 150.0000 mg | ORAL_TABLET | ORAL | Status: DC

## 2014-05-23 ENCOUNTER — Telehealth: Payer: Self-pay | Admitting: *Deleted

## 2014-05-23 MED ORDER — ZOLPIDEM TARTRATE ER 12.5 MG PO TBCR: 12.5000 mg | EXTENDED_RELEASE_TABLET | Freq: Every evening | ORAL | Status: DC | PRN

## 2014-05-23 NOTE — Telephone Encounter (Signed)
Patient tried to not take the Ambien any longer but that is not working out for her and she is only sleeping max 4 hours per night.  She would like to have refills called into her pharmacy.

## 2014-06-02 ENCOUNTER — Encounter: Payer: Self-pay | Admitting: Podiatrist

## 2014-07-08 ENCOUNTER — Encounter (HOSPITAL_COMMUNITY): Payer: Self-pay | Admitting: Emergency Medicine

## 2014-07-08 ENCOUNTER — Emergency Department (HOSPITAL_COMMUNITY)
Admission: EM | Admit: 2014-07-08 | Discharge: 2014-07-08 | Disposition: A | Discharge status: Home / Self Care | Payer: Medicare Other | Attending: Emergency Medicine | Admitting: Emergency Medicine

## 2014-07-08 DIAGNOSIS — Z87442 Personal history of urinary calculi: Secondary | ICD-10-CM | POA: Insufficient documentation

## 2014-07-08 DIAGNOSIS — F419 Anxiety disorder, unspecified: Secondary | ICD-10-CM | POA: Diagnosis not present

## 2014-07-08 DIAGNOSIS — Z8679 Personal history of other diseases of the circulatory system: Secondary | ICD-10-CM | POA: Insufficient documentation

## 2014-07-08 DIAGNOSIS — R110 Nausea: Secondary | ICD-10-CM | POA: Insufficient documentation

## 2014-07-08 DIAGNOSIS — F329 Major depressive disorder, single episode, unspecified: Secondary | ICD-10-CM | POA: Insufficient documentation

## 2014-07-08 DIAGNOSIS — Z9851 Tubal ligation status: Secondary | ICD-10-CM | POA: Insufficient documentation

## 2014-07-08 DIAGNOSIS — R109 Unspecified abdominal pain: Secondary | ICD-10-CM | POA: Insufficient documentation

## 2014-07-08 DIAGNOSIS — Z79899 Other long term (current) drug therapy: Secondary | ICD-10-CM | POA: Insufficient documentation

## 2014-07-08 LAB — URINALYSIS, ROUTINE W REFLEX MICROSCOPIC
Glucose, UA: NEGATIVE mg/dL
Hgb urine dipstick: NEGATIVE
KETONES UR: NEGATIVE mg/dL
LEUKOCYTES UA: NEGATIVE
NITRITE: NEGATIVE
Protein, ur: NEGATIVE mg/dL
Specific Gravity, Urine: 1.024 (ref 1.005–1.030)
Urobilinogen, UA: 1 mg/dL (ref 0.0–1.0)
pH: 6 (ref 5.0–8.0)

## 2014-07-08 LAB — COMPREHENSIVE METABOLIC PANEL
ALBUMIN: 3.9 g/dL (ref 3.5–5.2)
ALK PHOS: 73 U/L (ref 39–117)
ALT: 25 U/L (ref 0–35)
ANION GAP: 6 (ref 5–15)
AST: 19 U/L (ref 0–37)
BILIRUBIN TOTAL: 1 mg/dL (ref 0.3–1.2)
BUN: 13 mg/dL (ref 6–23)
CALCIUM: 9.1 mg/dL (ref 8.4–10.5)
CO2: 28 mmol/L (ref 19–32)
CREATININE: 0.83 mg/dL (ref 0.50–1.10)
Chloride: 105 mmol/L (ref 96–112)
GFR calc non Af Amer: 71 mL/min — ABNORMAL LOW (ref >90)
GFR, EST AFRICAN AMERICAN: 82 mL/min — AB (ref >90)
Glucose, Bld: 115 mg/dL — ABNORMAL HIGH (ref 70–99)
Potassium: 3.8 mmol/L (ref 3.5–5.1)
SODIUM: 139 mmol/L (ref 135–145)
Total Protein: 7.1 g/dL (ref 6.0–8.3)

## 2014-07-08 LAB — CBC WITH DIFFERENTIAL/PLATELET
Basophils Absolute: 0 10*3/uL (ref 0.0–0.1)
Basophils Relative: 0 % (ref 0–1)
EOS ABS: 0 10*3/uL (ref 0.0–0.7)
Eosinophils Relative: 0 % (ref 0–5)
HCT: 44.7 % (ref 36.0–46.0)
Hemoglobin: 14.9 g/dL (ref 12.0–15.0)
LYMPHS ABS: 1.5 10*3/uL (ref 0.7–4.0)
Lymphocytes Relative: 17 % (ref 12–46)
MCH: 30.2 pg (ref 26.0–34.0)
MCHC: 33.3 g/dL (ref 30.0–36.0)
MCV: 90.5 fL (ref 78.0–100.0)
MONO ABS: 0.6 10*3/uL (ref 0.1–1.0)
MONOS PCT: 7 % (ref 3–12)
Neutro Abs: 6.6 10*3/uL (ref 1.7–7.7)
Neutrophils Relative %: 76 % (ref 43–77)
Platelets: 198 10*3/uL (ref 150–400)
RBC: 4.94 MIL/uL (ref 3.87–5.11)
RDW: 14 % (ref 11.5–15.5)
WBC: 8.7 10*3/uL (ref 4.0–10.5)

## 2014-07-08 LAB — LIPASE, BLOOD: LIPASE: 38 U/L (ref 11–59)

## 2014-07-08 MED ORDER — ONDANSETRON 4 MG PO TBDP: 4.0000 mg | ORAL_TABLET | Freq: Three times a day (TID) | ORAL | Status: DC | PRN

## 2014-07-08 MED ORDER — OMEPRAZOLE 20 MG PO CPDR: 20.0000 mg | DELAYED_RELEASE_CAPSULE | Freq: Two times a day (BID) | ORAL | Status: DC

## 2014-07-08 MED ORDER — SODIUM CHLORIDE 0.9 % IV BOLUS (SEPSIS)
1000.0000 mL | Freq: Once | INTRAVENOUS | Status: AC
  Administered 2014-07-08: 1000 mL via INTRAVENOUS

## 2014-07-08 MED ORDER — ONDANSETRON HCL 4 MG/2ML IJ SOLN
4.0000 mg | Freq: Once | INTRAMUSCULAR | Status: AC
  Administered 2014-07-08: 4 mg via INTRAVENOUS
  Filled 2014-07-08: qty 2

## 2014-07-08 NOTE — ED Notes (Addendum)
Per pt, states nausea for a few days and not getting better-no vomiting

## 2014-07-08 NOTE — Discharge Instructions (Signed)

## 2014-07-08 NOTE — ED Provider Notes (Signed)
CSN: [id number]     Arrival date & time 07/08/14  1142 History   First MD Initiated Contact with Patient 07/08/14 1153     Chief Complaint  Patient presents with  . Nausea     HPI  She presents for evaluation of nausea. Nausea for about the last 4-5 days. Not vomiting. Is taking less by mouth because of it. Able to take her medications. No vomiting, normal bowel movements. No dysuria or hematuria. No cough, or sputum production. No difficulty breathing. No pain.  Past Medical History  Diagnosis Date  . Anxiety   . Depression   . Migraine   . Abnormal Pap smear     5 YEARS AGO  . Personal history of kidney stones    Past Surgical History  Procedure Laterality Date  . Tubal ligation      29 YEARS AGO  . Ectopic pregnancy surgery  1977  . Tonsillectomy    . Gallbladder surgery      REMOVED  . Laparoscopic nephrectomy     Family History  Problem Relation Age of Onset  . Heart disease Father     MASSIVE HEART ATTACK  . Hypertension Father   . Cancer Father     colon cancer  . Heart attack Father   . Heart disease Mother   . Hypertension Mother   . Clotting disorder Mother   . Cancer Mother     liver and colon   History  Substance Use Topics  . Smoking status: Never Smoker   . Smokeless tobacco: Not on file  . Alcohol Use: No   OB History    No data available     Review of Systems  Constitutional: Negative for fever, chills, diaphoresis, appetite change and fatigue.  HENT: Negative for mouth sores, sore throat and trouble swallowing.   Eyes: Negative for visual disturbance.  Respiratory: Negative for cough, chest tightness, shortness of breath and wheezing.   Cardiovascular: Negative for chest pain.  Gastrointestinal: Positive for nausea and abdominal pain. Negative for vomiting, diarrhea and abdominal distention.  Endocrine: Negative for polydipsia, polyphagia and polyuria.  Genitourinary: Negative for dysuria, frequency and hematuria.  Musculoskeletal:  Negative for gait problem.  Skin: Negative for color change, pallor and rash.  Neurological: Negative for dizziness, syncope, light-headedness and headaches.  Hematological: Does not bruise/bleed easily.  Psychiatric/Behavioral: Negative for behavioral problems and confusion.      Allergies  Review of patient's allergies indicates no known allergies.  Home Medications   Prior to Admission medications   Medication Sig Start Date End Date Taking? Authorizing Provider  ALPRAZolam (XANAX) 0.25 MG tablet Take 1 tablet (0.25 mg total) by mouth at bedtime as needed for sleep. 08/31/12  Yes Olegario Messier, NP  zolpidem (AMBIEN CR) 12.5 MG CR tablet Take 1 tablet (12.5 mg total) by mouth at bedtime as needed. Patient taking differently: Take 12.5 mg by mouth at bedtime.  05/23/14  Yes Olegario Messier, NP  fluconazole (DIFLUCAN) 150 MG tablet Take 1 tablet (150 mg total) by mouth once a week. Patient not taking: Reported on 07/08/2014 05/18/14   Bronson Ing, DPM  levETIRAcetam (KEPPRA XR) 500 MG 24 hr tablet Take 1 tablet (500 mg total) by mouth daily. Patient not taking: Reported on 07/08/2014 03/14/14 10/09/15  Olegario Messier, NP  omeprazole (PRILOSEC) 20 MG capsule Take 1 capsule (20 mg total) by mouth 2 (two) times daily. 07/08/14   Tanna Furry, MD  ondansetron (ZOFRAN ODT) 4  MG disintegrating tablet Take 1 tablet (4 mg total) by mouth every 8 (eight) hours as needed for nausea. 07/08/14   Tanna Furry, MD   BP 138/77 mmHg  Pulse 68  Temp(Src) 98 F (36.7 C) (Oral)  Resp 16  SpO2 99% Physical Exam  Constitutional: She is oriented to person, place, and time. She appears well-developed and well-nourished. No distress.  HENT:  Head: Normocephalic.  Eyes: Conjunctivae are normal. Pupils are equal, round, and reactive to light. No scleral icterus.  Neck: Normal range of motion. Neck supple. No thyromegaly present.  Cardiovascular: Normal rate and regular rhythm.  Exam reveals no gallop and  no friction rub.   No murmur heard. Pulmonary/Chest: Effort normal and breath sounds normal. No respiratory distress. She has no wheezes. She has no rales.  Abdominal: Soft. Bowel sounds are normal. She exhibits no distension. There is no tenderness. There is no rebound.  Soft benign abdomen. Very rebound or peritoneal irritation.  Musculoskeletal: Normal range of motion.  Neurological: She is alert and oriented to person, place, and time.  Skin: Skin is warm and dry. No rash noted.  Psychiatric: She has a normal mood and affect. Her behavior is normal.    ED Course  Procedures (including critical care time) Labs Review Labs Reviewed  COMPREHENSIVE METABOLIC PANEL - Abnormal; Notable for the following:    Glucose, Bld 115 (*)    GFR calc non Af Amer 71 (*)    GFR calc Af Amer 82 (*)    All other components within normal limits  URINALYSIS, ROUTINE W REFLEX MICROSCOPIC - Abnormal; Notable for the following:    Color, Urine AMBER (*)    APPearance CLOUDY (*)    Bilirubin Urine SMALL (*)    All other components within normal limits  CBC WITH DIFFERENTIAL/PLATELET  LIPASE, BLOOD    Imaging Review No results found.   EKG Interpretation None      MDM   Final diagnoses:  Nausea    Feeling better. Reassuring studies. She is appropriate for outpatient treatment with antiemetics, PPI, term precautions discussed.    Tanna Furry, MD 07/08/14 1500

## 2014-08-01 ENCOUNTER — Encounter: Payer: Medicare Other | Admitting: Nurse Practitioner

## 2014-08-08 ENCOUNTER — Encounter: Payer: Medicare Other | Admitting: Nurse Practitioner

## 2014-08-22 ENCOUNTER — Encounter: Payer: Self-pay | Admitting: Nurse Practitioner

## 2014-08-22 ENCOUNTER — Ambulatory Visit (INDEPENDENT_AMBULATORY_CARE_PROVIDER_SITE_OTHER): Payer: Medicare Other | Admitting: Nurse Practitioner

## 2014-08-22 VITALS — BP 169/94 | HR 99 | Ht 61.0 in | Wt 175.8 lb

## 2014-08-22 DIAGNOSIS — I10 Essential (primary) hypertension: Secondary | ICD-10-CM

## 2014-08-22 DIAGNOSIS — G4700 Insomnia, unspecified: Secondary | ICD-10-CM | POA: Diagnosis not present

## 2014-08-22 DIAGNOSIS — F419 Anxiety disorder, unspecified: Secondary | ICD-10-CM

## 2014-08-22 DIAGNOSIS — G43009 Migraine without aura, not intractable, without status migrainosus: Secondary | ICD-10-CM | POA: Diagnosis not present

## 2014-08-22 MED ORDER — PROMETHAZINE HCL 25 MG PO TABS: 25.0000 mg | ORAL_TABLET | Freq: Three times a day (TID) | ORAL | Status: DC | PRN

## 2014-08-22 MED ORDER — LEVETIRACETAM 750 MG PO TABS: 750.0000 mg | ORAL_TABLET | Freq: Every day | ORAL | Status: DC

## 2014-08-22 NOTE — Progress Notes (Signed)
History:  Sheri Soto is a 69 y.o. No obstetric history on file. who presents to [facility] clinic today for follow up with migraine headaches. She was last seen 02/2014. Since then she has not found PCP and not gotten any medications for BP. She was seen once in ED for several days of nausea and given zofran and prilosec. She testing all came back negative.  She feels like her migraines are a bit worse and she would like to go up on Keppra. She was not able to stop taking Ambien.   The following portions of the patient's history were reviewed and updated as appropriate: allergies, current medications, past family history, past medical history, past social history, past surgical history and problem list.  Review of Systems:  Pertinent items are noted in HPI.  Objective:  Physical Exam BP 169/94 mmHg  Pulse 99  Ht 5\' 1"  (1.549 m)  Wt 175 lb 12.8 oz (79.742 kg)  BMI 33.23 kg/m2 GENERAL: Well-developed, well-nourished female in no acute distress.  HEENT: Normocephalic, atraumatic.  NECK: Supple. Normal thyroid.  LUNGS: Normal rate. Clear to auscultation bilaterally.  HEART: Regular rate and rhythm with no adventitious sounds.  EXTREMITIES: No cyanosis, clubbing, or edema, 2+ distal pulses.   Labs and Imaging No results found.  Assessment & Plan:  Assessment:  Migraines Anxiety Uncontrolled hypertension Insomnia  Plans: Pt is again advised to find PCP/ we reviewed the possibility of stroke with elevated BP Will increase dose of Keppra to 750 mg  Add phenergan to Vicoden for severe migraines since she can not take Triptans RTC one year or prn  Olegario Messier, NP 08/22/2014 1:21 PM

## 2014-08-22 NOTE — Patient Instructions (Signed)

## 2014-10-25 ENCOUNTER — Telehealth: Payer: Self-pay | Admitting: Physician Assistant

## 2014-10-25 NOTE — Telephone Encounter (Signed)
Pt requesting Ambien refill. Okay to refill one time, #30.  Encourage pt to see PCP.  Return here if no improvement.

## 2014-10-26 ENCOUNTER — Telehealth: Payer: Self-pay | Admitting: *Deleted

## 2014-10-26 NOTE — Telephone Encounter (Signed)
Pt called and needed refills on her Ambien.  I have called in 4 refills on her Ambien to her pharmacy.  Pt aware.

## 2015-03-20 ENCOUNTER — Telehealth: Payer: Self-pay | Admitting: *Deleted

## 2015-03-20 DIAGNOSIS — Z1231 Encounter for screening mammogram for malignant neoplasm of breast: Secondary | ICD-10-CM

## 2015-03-20 DIAGNOSIS — Z1239 Encounter for other screening for malignant neoplasm of breast: Secondary | ICD-10-CM

## 2015-03-20 NOTE — Telephone Encounter (Signed)
Pt on breast cancer screening list for having office visit this year and mammogram not scheduled.  Spoke to pt, will schedule and call with appt.

## 2015-03-27 ENCOUNTER — Encounter: Payer: Medicare Other | Admitting: Physician Assistant

## 2015-04-02 ENCOUNTER — Telehealth: Payer: Self-pay | Admitting: Family Medicine

## 2015-04-02 NOTE — Telephone Encounter (Signed)
SPOKE WITH PATIENT AND SHE WILL COME IN ON 04/18/15 AT 945 AND SEE DEBBIE GESSNER FOR A CPE.  SHE IS ALREADY SCHEDULED TO HAVE A MAMMOGRAM AT [place]

## 2015-04-03 ENCOUNTER — Ambulatory Visit: Payer: Medicare Other

## 2015-04-11 ENCOUNTER — Encounter: Payer: Self-pay | Admitting: Podiatry

## 2015-04-11 ENCOUNTER — Ambulatory Visit (INDEPENDENT_AMBULATORY_CARE_PROVIDER_SITE_OTHER): Payer: Medicare Other | Admitting: Podiatry

## 2015-04-11 DIAGNOSIS — B351 Tinea unguium: Secondary | ICD-10-CM

## 2015-04-11 DIAGNOSIS — M79676 Pain in unspecified toe(s): Secondary | ICD-10-CM

## 2015-04-11 NOTE — Progress Notes (Signed)
Patient ID: Sheri Soto, female   DOB: 08-Apr-1946, 69 y.o.   MRN: [id number] Complaint:  Visit Type: Patient returns to my office for continued preventative foot care services. Complaint: Patient states" my nails have grown long and thick and become painful to walk and wear shoes" . The patient presents for preventative foot care services. No changes to ROS  Podiatric Exam: Vascular: dorsalis pedis and posterior tibial pulses are palpable bilateral. Capillary return is immediate. Temperature gradient is WNL. Skin turgor WNL  Sensorium: Normal Semmes Weinstein monofilament test. Normal tactile sensation bilaterally. Nail Exam: Pt has thick disfigured discolored nails with subungual debris noted  First and second toes right foot. Ulcer Exam: There is no evidence of ulcer or pre-ulcerative changes or infection. Orthopedic Exam: Muscle tone and strength are WNL. No limitations in general ROM. No crepitus or effusions noted. Foot type and digits show no abnormalities. Bony prominences are unremarkable. 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 Skin: No Porokeratosis. No infection or ulcers  Diagnosis:  Onychomycosis, , Pain in right toe, pain in left toes  Treatment & Plan Procedures and Treatment: Consent by patient was obtained for treatment procedures. The patient understood the discussion of treatment and procedures well. All questions were answered thoroughly reviewed. Debridement of mycotic and hypertrophic toenails, 1 through 5 bilateral and clearing of subungual debris. No ulceration, no infection noted.  Return Visit-Office Procedure: Patient instructed to return to the office for a follow up visit 3 months for continued evaluation and treatment.

## 2015-04-18 ENCOUNTER — Ambulatory Visit (INDEPENDENT_AMBULATORY_CARE_PROVIDER_SITE_OTHER): Payer: Medicare Other | Admitting: Family Medicine

## 2015-04-18 ENCOUNTER — Telehealth: Payer: Self-pay

## 2015-04-18 ENCOUNTER — Encounter: Payer: Self-pay | Admitting: Family Medicine

## 2015-04-18 VITALS — BP 130/80 | HR 85 | Temp 98.3°F | Resp 16 | Ht 60.5 in | Wt 173.2 lb

## 2015-04-18 DIAGNOSIS — N393 Stress incontinence (female) (male): Secondary | ICD-10-CM | POA: Diagnosis not present

## 2015-04-18 DIAGNOSIS — G44209 Tension-type headache, unspecified, not intractable: Secondary | ICD-10-CM | POA: Diagnosis not present

## 2015-04-18 DIAGNOSIS — E669 Obesity, unspecified: Secondary | ICD-10-CM | POA: Diagnosis not present

## 2015-04-18 DIAGNOSIS — R739 Hyperglycemia, unspecified: Secondary | ICD-10-CM

## 2015-04-18 DIAGNOSIS — R5383 Other fatigue: Secondary | ICD-10-CM | POA: Diagnosis not present

## 2015-04-18 DIAGNOSIS — Z6833 Body mass index (BMI) 33.0-33.9, adult: Secondary | ICD-10-CM | POA: Diagnosis not present

## 2015-04-18 DIAGNOSIS — R7309 Other abnormal glucose: Secondary | ICD-10-CM

## 2015-04-18 DIAGNOSIS — Z0000 Encounter for general adult medical examination without abnormal findings: Secondary | ICD-10-CM | POA: Diagnosis not present

## 2015-04-18 DIAGNOSIS — R32 Unspecified urinary incontinence: Secondary | ICD-10-CM

## 2015-04-18 LAB — COMPREHENSIVE METABOLIC PANEL
ALBUMIN: 4.2 g/dL (ref 3.6–5.1)
ALK PHOS: 86 U/L (ref 33–130)
ALT: 25 U/L (ref 6–29)
AST: 19 U/L (ref 10–35)
BILIRUBIN TOTAL: 0.9 mg/dL (ref 0.2–1.2)
BUN: 16 mg/dL (ref 7–25)
CHLORIDE: 102 mmol/L (ref 98–110)
CO2: 26 mmol/L (ref 20–31)
CREATININE: 0.72 mg/dL (ref 0.50–0.99)
Calcium: 9.3 mg/dL (ref 8.6–10.4)
Glucose, Bld: 121 mg/dL — ABNORMAL HIGH (ref 65–99)
Potassium: 4.4 mmol/L (ref 3.5–5.3)
SODIUM: 139 mmol/L (ref 135–146)
TOTAL PROTEIN: 7.2 g/dL (ref 6.1–8.1)

## 2015-04-18 LAB — CBC WITH DIFFERENTIAL/PLATELET
BASOS ABS: 0.1 10*3/uL (ref 0.0–0.1)
BASOS PCT: 1 % (ref 0–1)
Eosinophils Absolute: 0 10*3/uL (ref 0.0–0.7)
Eosinophils Relative: 0 % (ref 0–5)
HCT: 44.4 % (ref 36.0–46.0)
HEMOGLOBIN: 15 g/dL (ref 12.0–15.0)
LYMPHS ABS: 2.1 10*3/uL (ref 0.7–4.0)
Lymphocytes Relative: 25 % (ref 12–46)
MCH: 30.1 pg (ref 26.0–34.0)
MCHC: 33.8 g/dL (ref 30.0–36.0)
MCV: 89 fL (ref 78.0–100.0)
MONOS PCT: 5 % (ref 3–12)
MPV: 12.6 fL — AB (ref 8.6–12.4)
Monocytes Absolute: 0.4 10*3/uL (ref 0.1–1.0)
NEUTROS ABS: 5.7 10*3/uL (ref 1.7–7.7)
NEUTROS PCT: 69 % (ref 43–77)
PLATELETS: 237 10*3/uL (ref 150–400)
RBC: 4.99 MIL/uL (ref 3.87–5.11)
RDW: 14.7 % (ref 11.5–15.5)
WBC: 8.3 10*3/uL (ref 4.0–10.5)

## 2015-04-18 LAB — LIPID PANEL
CHOL/HDL RATIO: 5.3 ratio — AB (ref <=5.0)
CHOLESTEROL: 228 mg/dL — AB (ref 125–200)
HDL: 43 mg/dL — ABNORMAL LOW (ref >=46)
LDL CALC: 158 mg/dL — AB (ref <130)
TRIGLYCERIDES: 135 mg/dL (ref <150)
VLDL: 27 mg/dL (ref <30)

## 2015-04-18 LAB — HEMOGLOBIN A1C
HEMOGLOBIN A1C: 6.3 % — AB (ref <5.7)
Mean Plasma Glucose: 134 mg/dL — ABNORMAL HIGH (ref <117)

## 2015-04-18 LAB — TSH: TSH: 0.888 u[IU]/mL (ref 0.350–4.500)

## 2015-04-18 MED ORDER — CYCLOBENZAPRINE HCL 5 MG PO TABS: 5.0000 mg | ORAL_TABLET | Freq: Every evening | ORAL | Status: DC | PRN

## 2015-04-18 MED ORDER — NAPROXEN 500 MG PO TABS: 500.0000 mg | ORAL_TABLET | Freq: Two times a day (BID) | ORAL | Status: DC

## 2015-04-18 NOTE — Progress Notes (Signed)
Subjective:    Patient ID: Sheri Soto, female    DOB: [birth year], 68 y.o.   MRN: [id number]  HPI This is a pleasant 69 yo female who presents for medicare annual wellness visit.    Last CPE- unknown Mammo- 4/14 Pap- 4/14- normal Colonoscopy- 6 years ago, had polyp removed, did not return for colonoscopy when requested, not interested in colonoscopy or cologuard stool testing. Tdap- 2012 Flu- annually Eye- 4 months ago, no chnages Dental- regular Exercise-no  Her only complaint today is chronic headaches. She is having a daily tension, frontal headache. She has had them daily for a week. This is different than her migraine headaches. She is taking advil 400 mg twice a day without relief. Does not feel like sinus pressure, no neck pain or radiation up or down. Has felt a little lightheaded over the last couple of days. She was taking a bath last night and extended her neck and saw some sparkles until she raised her head. No photophobia/phonophobia. Had been on Kepra in past with ok results. Pain 8/10 today. Pain worse with activity.   Past Medical History  Diagnosis Date  . Anxiety   . Depression   . Migraine   . Abnormal Pap smear     5 YEARS AGO  . Personal history of kidney stones    Past Surgical History  Procedure Laterality Date  . Tubal ligation      29 YEARS AGO  . Ectopic pregnancy surgery  1977  . Tonsillectomy    . Gallbladder surgery      REMOVED  . Laparoscopic nephrectomy     Family History  Problem Relation Age of Onset  . Heart disease Father     MASSIVE HEART ATTACK  . Hypertension Father   . Cancer Father     colon cancer  . Heart attack Father   . Heart disease Mother   . Hypertension Mother   . Clotting disorder Mother   . Cancer Mother     liver and colon  . Heart disease Paternal Grandmother    Social History  Substance Use Topics  . Smoking status: Never Smoker   . Smokeless tobacco: None  . Alcohol Use: No     Review of Systems    Constitutional: Positive for fatigue (unchanged).  HENT: Negative.   Eyes: Negative.   Respiratory: Negative.   Cardiovascular: Negative.   Gastrointestinal: Negative.   Endocrine: Negative.   Genitourinary:       Longstanding urinary incontinence.   Musculoskeletal: Negative.   Skin: Negative.   Allergic/Immunologic: Negative.   Neurological: Negative.   Hematological: Negative.   Psychiatric/Behavioral: Negative.       Objective:   Physical Exam Physical Exam  Constitutional: She is oriented to person, place, and time. She appears well-developed and well-nourished. No distress.  HENT:  Head: Normocephalic and atraumatic.  Right Ear: External ear normal.  Left Ear: External ear normal.  Nose: Nose normal.  Mouth/Throat: Oropharynx is clear and moist. No oropharyngeal exudate.  Eyes: Conjunctivae are normal. Pupils are equal, round, and reactive to light.  Neck: Normal range of motion. Neck supple. No JVD present. No thyromegaly present.  Cardiovascular: Normal rate, regular rhythm, normal heart sounds and intact distal pulses.   Pulmonary/Chest: Effort normal and breath sounds normal. Right breast exhibits no inverted nipple, no mass, no nipple discharge, no skin change and no tenderness. Left breast exhibits no inverted nipple, no mass, no nipple discharge, no skin change and no  tenderness. Breasts are symmetrical.  Abdominal: Soft. Bowel sounds are normal. She exhibits no distension and no mass. There is no tenderness. There is no rebound and no guarding.  Musculoskeletal: Normal range of motion. She exhibits no edema or tenderness.  Lymphadenopathy:    She has no cervical adenopathy.  Neurological: She is alert and oriented to person, place, and time. She has normal reflexes. CN II-XII intact.  Skin: Skin is warm and dry. She is not diaphoretic.  Psychiatric: She has a normal mood and affect. Her behavior is normal. Judgment and thought content normal.  Vitals  reviewed. BP 130/80 mmHg  Pulse 85  Temp(Src) 98.3 F (36.8 C) (Oral)  Resp 16  Ht 5' 0.5" (1.537 m)  Wt 173 lb 3.2 oz (78.563 kg)  BMI 33.26 kg/m2  SpO2 96% Wt Readings from Last 3 Encounters:  04/18/15 173 lb 3.2 oz (78.563 kg)  08/22/14 175 lb 12.8 oz (79.742 kg)  03/30/14 177 lb (80.287 kg)   Depression screen PHQ 2/9 04/18/2015  Decreased Interest 0  Down, Depressed, Hopeless 0  PHQ - 2 Score 0        Assessment & Plan:  1. Annual physical exam  - discussed health maintenance items with patient- She cancelled mammogram but is considering rescheduling, she does not desire Cologuard screening or further colonoscopy. - encouraged incorporating a little more activity into her daily regimen- stretching, parking a little farther away when running errands.   2. Elevated blood sugar - Comprehensive metabolic panel - TSH - Hemoglobin A1c  3. BMI 33.0-33.9,adult - CBC with Differential/Platelet - Comprehensive metabolic panel  4. Incontinence in female - encouraged Kegel exercises, provided written instructions - patient unable to void today to check urine  5. Obesity - Lipid panel - TSH  6. Other fatigue - TSH  7. Tension headache - cyclobenzaprine (FLEXERIL) 5 MG tablet; Take 1 tablet (5 mg total) by mouth at bedtime as needed for muscle spasms.  Dispense: 10 tablet; Refill: 0 - naproxen (NAPROSYN) 500 MG tablet; Take 1 tablet (500 mg total) by mouth 2 (two) times daily with a meal.  Dispense: 30 tablet; Refill: 0 - if no improvement in a couple of days can refer to headache center or neuro.   Clarene Reamer, FNP-BC  [facility]  04/18/2015 10:52 AM

## 2015-04-18 NOTE — Telephone Encounter (Signed)
Sig: Take 1 tablet (5 mg total) by mouth at bedtime as needed for muscle spasms.  Can pt take this during the day?

## 2015-04-18 NOTE — Patient Instructions (Signed)
Kegel Exercises  The goal of Kegel exercises is to isolate and exercise your pelvic floor muscles. These muscles act as a hammock that supports the rectum, vagina, small intestine, and uterus. As the muscles weaken, the hammock sags and these organs are displaced from their normal positions. Kegel exercises can strengthen your pelvic floor muscles and help you to improve bladder and bowel control, improve sexual response, and help reduce many problems and some discomfort during pregnancy. Kegel exercises can be done anywhere and at any time.  HOW TO PERFORM KEGEL EXERCISES  1. Locate your pelvic floor muscles. To do this, squeeze (contract) the muscles that you use when you try to stop the flow of urine. You will feel a tightness in the vaginal area (women) and a tight lift in the rectal area (men and women).  2. When you begin, contract your pelvic muscles tight for 2-5 seconds, then relax them for 2-5 seconds. This is one set. Do 4-5 sets with a short pause in between.  3. Contract your pelvic muscles for 8-10 seconds, then relax them for 8-10 seconds. Do 4-5 sets. If you cannot contract your pelvic muscles for 8-10 seconds, try 5-7 seconds and work your way up to 8-10 seconds. Your goal is 4-5 sets of 10 contractions each day.  Keep your stomach, buttocks, and legs relaxed during the exercises. Perform sets of both short and long contractions. Vary your positions. Perform these contractions 3-4 times per day. Perform sets while you are:    · Lying in bed in the morning.  · Standing at lunch.  · Sitting in the late afternoon.  · Lying in bed at night.   You should do 40-50 contractions per day. Do not perform more Kegel exercises per day than recommended. Overexercising can cause muscle fatigue. Continue these exercises for for at least 15-20 weeks or as directed by your caregiver.     This information is not intended to replace advice given to you by your health care provider. Make sure you discuss any questions  you have with your health care provider.     Document Released: 05/19/2012 Document Revised: 06/23/2014 Document Reviewed: 05/19/2012  Elsevier Interactive Patient Education ©2016 [employer]

## 2015-04-18 NOTE — Telephone Encounter (Signed)
Pt saw Sheri Soto this morning and was given flexiril and would like to know if this is one tablet daily or just as needed at bed time

## 2015-04-19 NOTE — Telephone Encounter (Signed)
Called patient to clarify dosing. She took one dose of naproxyn with some relief of headache and took cyclobenzaprine last night, headache resolved today. Instructed patient to take medication sparingly. Cyclobenzaprine can cause drowsiness, and reminded patient of this.

## 2015-04-23 ENCOUNTER — Telehealth: Payer: Self-pay

## 2015-04-23 NOTE — Telephone Encounter (Signed)
Pt states she had a CPE with Neoma Laming and have questions regarding her blood work. Please call [phone]

## 2015-04-23 NOTE — Telephone Encounter (Signed)
Spoke with pt, I discussed labs with her and answered all her questions on the labs.

## 2015-05-17 ENCOUNTER — Ambulatory Visit
Admission: RE | Admit: 2015-05-17 | Discharge: 2015-05-17 | Disposition: A | Discharge status: Home / Self Care | Payer: Medicare Other | Source: Ambulatory Visit | Attending: Family Medicine | Admitting: Family Medicine

## 2015-05-17 DIAGNOSIS — Z1231 Encounter for screening mammogram for malignant neoplasm of breast: Secondary | ICD-10-CM

## 2015-05-18 DIAGNOSIS — L820 Inflamed seborrheic keratosis: Secondary | ICD-10-CM | POA: Diagnosis not present

## 2015-05-18 DIAGNOSIS — L821 Other seborrheic keratosis: Secondary | ICD-10-CM | POA: Diagnosis not present

## 2015-05-18 DIAGNOSIS — D485 Neoplasm of uncertain behavior of skin: Secondary | ICD-10-CM | POA: Diagnosis not present

## 2015-07-04 ENCOUNTER — Encounter: Payer: Self-pay | Admitting: Podiatry

## 2015-07-04 ENCOUNTER — Ambulatory Visit (INDEPENDENT_AMBULATORY_CARE_PROVIDER_SITE_OTHER): Payer: Medicare Other | Admitting: Podiatry

## 2015-07-04 DIAGNOSIS — B351 Tinea unguium: Secondary | ICD-10-CM

## 2015-07-04 DIAGNOSIS — M79676 Pain in unspecified toe(s): Secondary | ICD-10-CM | POA: Diagnosis not present

## 2015-07-04 NOTE — Progress Notes (Signed)
Patient ID: Clea W Cupo, female   DOB: 05/01/1946, 69 y.o.   MRN: [id number] Complaint:  Visit Type: Patient returns to my office for continued preventative foot care services. Complaint: Patient states" my nails have grown long and thick and become painful to walk and wear shoes" . The patient presents for preventative foot care services. No changes to ROS  Podiatric Exam: Vascular: dorsalis pedis and posterior tibial pulses are palpable bilateral. Capillary return is immediate. Temperature gradient is WNL. Skin turgor WNL  Sensorium: Normal Semmes Weinstein monofilament test. Normal tactile sensation bilaterally. Nail Exam: Pt has thick disfigured discolored nails with subungual debris noted  First and second toes right foot. Ulcer Exam: There is no evidence of ulcer or pre-ulcerative changes or infection. Orthopedic Exam: Muscle tone and strength are WNL. No limitations in general ROM. No crepitus or effusions noted. Foot type and digits show no abnormalities. Bony prominences are unremarkable. 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 Skin: No Porokeratosis. No infection or ulcers  Diagnosis:  Onychomycosis, , Pain in right toe, pain in left toes  Treatment & Plan Procedures and Treatment: Consent by patient was obtained for treatment procedures. The patient understood the discussion of treatment and procedures well. All questions were answered thoroughly reviewed. Debridement of mycotic and hypertrophic toenails, 1 through 5 bilateral and clearing of subungual debris. No ulceration, no infection noted.  Return Visit-Office Procedure: Patient instructed to return to the office for a follow up visit 3 months for continued evaluation and treatment. 

## 2015-09-26 ENCOUNTER — Encounter: Payer: Self-pay | Admitting: Podiatry

## 2015-09-26 ENCOUNTER — Ambulatory Visit (INDEPENDENT_AMBULATORY_CARE_PROVIDER_SITE_OTHER): Payer: Medicare Other | Admitting: Podiatry

## 2015-09-26 DIAGNOSIS — B351 Tinea unguium: Secondary | ICD-10-CM | POA: Diagnosis not present

## 2015-09-26 DIAGNOSIS — M79676 Pain in unspecified toe(s): Secondary | ICD-10-CM

## 2015-09-26 NOTE — Progress Notes (Signed)
Patient ID: Sheri Soto, female   DOB: 28-Dec-1945, 70 y.o.   MRN: [id number] Complaint:  Visit Type: Patient returns to my office for continued preventative foot care services. Complaint: Patient states" my nails have grown long and thick and become painful to walk and wear shoes" . The patient presents for preventative foot care services. No changes to ROS  Podiatric Exam: Vascular: dorsalis pedis and posterior tibial pulses are palpable bilateral. Capillary return is immediate. Temperature gradient is WNL. Skin turgor WNL  Sensorium: Normal Semmes Weinstein monofilament test. Normal tactile sensation bilaterally. Nail Exam: Pt has thick disfigured discolored nails with subungual debris noted  First and second toes right foot. Ulcer Exam: There is no evidence of ulcer or pre-ulcerative changes or infection. Orthopedic Exam: Muscle tone and strength are WNL. No limitations in general ROM. No crepitus or effusions noted. Foot type and digits show no abnormalities. Bony prominences are unremarkable. 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 Skin: No Porokeratosis. No infection or ulcers  Diagnosis:  Onychomycosis, , Pain in right toe, pain in left toes  Treatment & Plan Procedures and Treatment: Consent by patient was obtained for treatment procedures. The patient understood the discussion of treatment and procedures well. All questions were answered thoroughly reviewed. Debridement of mycotic and hypertrophic toenails, 1 through 5 bilateral and clearing of subungual debris. No ulceration, no infection noted.  Return Visit-Office Procedure: Patient instructed to return to the office for a follow up visit 3 months for continued evaluation and treatment.   Gardiner Barefoot DPM

## 2015-12-20 ENCOUNTER — Encounter: Payer: Self-pay | Admitting: Podiatry

## 2015-12-20 ENCOUNTER — Ambulatory Visit (INDEPENDENT_AMBULATORY_CARE_PROVIDER_SITE_OTHER): Payer: Medicare Other | Admitting: Podiatry

## 2015-12-20 DIAGNOSIS — B351 Tinea unguium: Secondary | ICD-10-CM | POA: Diagnosis not present

## 2015-12-20 DIAGNOSIS — M79676 Pain in unspecified toe(s): Secondary | ICD-10-CM | POA: Diagnosis not present

## 2015-12-20 NOTE — Progress Notes (Signed)
Patient ID: Sheri Soto, female   DOB: [birth year], 70 y.o.   MRN: [id number] Complaint:  Visit Type: Patient returns to my office for continued preventative foot care services. Complaint: Patient states" my nails have grown long and thick and become painful to walk and wear shoes" . The patient presents for preventative foot care services. No changes to ROS  Podiatric Exam: Vascular: dorsalis pedis and posterior tibial pulses are palpable bilateral. Capillary return is immediate. Temperature gradient is WNL. Skin turgor WNL  Sensorium: Normal Semmes Weinstein monofilament test. Normal tactile sensation bilaterally. Nail Exam: Pt has thick disfigured discolored nails with subungual debris noted  First and second toes right foot. Ulcer Exam: There is no evidence of ulcer or pre-ulcerative changes or infection. Orthopedic Exam: Muscle tone and strength are WNL. No limitations in general ROM. No crepitus or effusions noted. Foot type and digits show no abnormalities. Bony prominences are unremarkable. 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 Skin: No Porokeratosis. No infection or ulcers  Diagnosis:  Onychomycosis, , Pain in right toe, pain in left toes  Treatment & Plan Procedures and Treatment: Consent by patient was obtained for treatment procedures. The patient understood the discussion of treatment and procedures well. All questions were answered thoroughly reviewed. Debridement of mycotic and hypertrophic toenails, 1 through 5 bilateral and clearing of subungual debris. No ulceration, no infection noted.  Return Visit-Office Procedure: Patient instructed to return to the office for a follow up visit 3 months for continued evaluation and treatment.   Gardiner Barefoot DPM

## 2015-12-27 ENCOUNTER — Ambulatory Visit: Payer: Medicare Other | Admitting: Podiatry

## 2016-01-21 ENCOUNTER — Encounter (HOSPITAL_COMMUNITY): Payer: Self-pay | Admitting: Emergency Medicine

## 2016-01-21 ENCOUNTER — Emergency Department (HOSPITAL_COMMUNITY): Payer: Medicare Other

## 2016-01-21 ENCOUNTER — Emergency Department (HOSPITAL_COMMUNITY)
Admission: EM | Admit: 2016-01-21 | Discharge: 2016-01-21 | Disposition: A | Discharge status: Home / Self Care | Payer: Medicare Other | Attending: Emergency Medicine | Admitting: Emergency Medicine

## 2016-01-21 DIAGNOSIS — Z79899 Other long term (current) drug therapy: Secondary | ICD-10-CM | POA: Diagnosis not present

## 2016-01-21 DIAGNOSIS — I10 Essential (primary) hypertension: Secondary | ICD-10-CM | POA: Diagnosis not present

## 2016-01-21 DIAGNOSIS — G44209 Tension-type headache, unspecified, not intractable: Secondary | ICD-10-CM

## 2016-01-21 DIAGNOSIS — N3000 Acute cystitis without hematuria: Secondary | ICD-10-CM | POA: Diagnosis not present

## 2016-01-21 DIAGNOSIS — N200 Calculus of kidney: Secondary | ICD-10-CM | POA: Diagnosis not present

## 2016-01-21 DIAGNOSIS — R109 Unspecified abdominal pain: Secondary | ICD-10-CM

## 2016-01-21 DIAGNOSIS — R1032 Left lower quadrant pain: Secondary | ICD-10-CM | POA: Diagnosis present

## 2016-01-21 DIAGNOSIS — R10A0 Flank pain, unspecified side: Secondary | ICD-10-CM

## 2016-01-21 LAB — CBC WITH DIFFERENTIAL/PLATELET
BASOS PCT: 0 %
Basophils Absolute: 0 10*3/uL (ref 0.0–0.1)
EOS ABS: 0 10*3/uL (ref 0.0–0.7)
EOS PCT: 0 %
HCT: 45.3 % (ref 36.0–46.0)
Hemoglobin: 15.5 g/dL — ABNORMAL HIGH (ref 12.0–15.0)
LYMPHS ABS: 0.5 10*3/uL — AB (ref 0.7–4.0)
Lymphocytes Relative: 3 %
MCH: 30.2 pg (ref 26.0–34.0)
MCHC: 34.2 g/dL (ref 30.0–36.0)
MCV: 88.3 fL (ref 78.0–100.0)
Monocytes Absolute: 0.7 10*3/uL (ref 0.1–1.0)
Monocytes Relative: 4 %
NEUTROS PCT: 93 %
Neutro Abs: 15.2 10*3/uL — ABNORMAL HIGH (ref 1.7–7.7)
PLATELETS: 176 10*3/uL (ref 150–400)
RBC: 5.13 MIL/uL — AB (ref 3.87–5.11)
RDW: 14.2 % (ref 11.5–15.5)
WBC: 16.4 10*3/uL — AB (ref 4.0–10.5)

## 2016-01-21 LAB — BASIC METABOLIC PANEL
Anion gap: 11 (ref 5–15)
BUN: 16 mg/dL (ref 6–20)
CO2: 22 mmol/L (ref 22–32)
CREATININE: 0.76 mg/dL (ref 0.44–1.00)
Calcium: 9.8 mg/dL (ref 8.9–10.3)
Chloride: 105 mmol/L (ref 101–111)
Glucose, Bld: 158 mg/dL — ABNORMAL HIGH (ref 65–99)
POTASSIUM: 3.7 mmol/L (ref 3.5–5.1)
SODIUM: 138 mmol/L (ref 135–145)

## 2016-01-21 LAB — URINALYSIS, ROUTINE W REFLEX MICROSCOPIC
BILIRUBIN URINE: NEGATIVE
Glucose, UA: NEGATIVE mg/dL
KETONES UR: 15 mg/dL — AB
NITRITE: POSITIVE — AB
PROTEIN: NEGATIVE mg/dL
SPECIFIC GRAVITY, URINE: 1.017 (ref 1.005–1.030)
pH: 6 (ref 5.0–8.0)

## 2016-01-21 LAB — URINE MICROSCOPIC-ADD ON

## 2016-01-21 MED ORDER — SODIUM CHLORIDE 0.9 % IV BOLUS (SEPSIS)
500.0000 mL | Freq: Once | INTRAVENOUS | Status: AC
  Administered 2016-01-21: 500 mL via INTRAVENOUS

## 2016-01-21 MED ORDER — DEXTROSE 5 % IV SOLN
1.0000 g | Freq: Once | INTRAVENOUS | Status: AC
  Administered 2016-01-21: 1 g via INTRAVENOUS
  Filled 2016-01-21: qty 10

## 2016-01-21 MED ORDER — CIPROFLOXACIN HCL 500 MG PO TABS: 500.0000 mg | ORAL_TABLET | Freq: Two times a day (BID) | ORAL | 0 refills | Status: DC

## 2016-01-21 NOTE — ED Notes (Signed)
Patient was alert, oriented and stable upon discharge. RN went over AVS and patient had no further questions.  

## 2016-01-21 NOTE — ED Notes (Signed)
Patient transported to CT 

## 2016-01-21 NOTE — ED Triage Notes (Signed)
Pt presented to the ED c/o abdominal pain and nausea x2 days. Denies vomiting. Pt stated hx of kidney stones and "it feels similar". Pt denies blood in urine.

## 2016-01-21 NOTE — ED Provider Notes (Signed)
[facility] DEPT Provider Note   CSN: [id number] Arrival date & time: 01/21/16  1239  First Provider Contact:  First MD Initiated Contact with Patient 01/21/16 1700        History   Chief Complaint Chief Complaint  Patient presents with  . Abdominal Pain    HPI Sheri Soto is a 70 y.o. female.  Patient presents with acute onset of intermittent writers, left flank pain now with radiation across her lower abdomen "like a band" and nausea starting yesterday. Patient states that symptoms are similar to previous kidney stones, which had 5 years ago. No vomiting. No chest pain or shortness of breath. No documented fevers. No vaginal bleeding or discharge. No hematuria or dysuria. No treatments prior to arrival. Pain is now gradually improved. Nothing makes symptoms better or worse.      Past Medical History:  Diagnosis Date  . Abnormal Pap smear    5 YEARS AGO  . Anxiety   . Depression   . Migraine   . Personal history of kidney stones     Patient Active Problem List   Diagnosis Date Noted  . Elevated cholesterol 03/14/2014  . Obesity 09/21/2012  . Essential hypertension, benign 09/21/2012  . Insomnia 04/15/2011  . Migraine 02/04/2011    Past Surgical History:  Procedure Laterality Date  . [place]  . GALLBLADDER SURGERY     REMOVED  . LAPAROSCOPIC NEPHRECTOMY    . TONSILLECTOMY    . TUBAL LIGATION     29 YEARS AGO    OB History    No data available       Home Medications    Prior to Admission medications   Medication Sig Start Date End Date Taking? Authorizing Provider  cyclobenzaprine (FLEXERIL) 5 MG tablet Take 1 tablet (5 mg total) by mouth at bedtime as needed for muscle spasms. 04/18/15   Elby Beck, FNP  FLUZONE HIGH-DOSE 0.5 ML SUSY FLU SHOT 02/05/15   Historical Provider, MD  naproxen (NAPROSYN) 500 MG tablet Take 1 tablet (500 mg total) by mouth 2 (two) times daily with a meal. 04/18/15   Elby Beck, FNP     Family History Family History  Problem Relation Age of Onset  . Heart disease Father     MASSIVE HEART ATTACK  . Hypertension Father   . Cancer Father     colon cancer  . Heart attack Father   . Heart disease Mother   . Hypertension Mother   . Clotting disorder Mother   . Cancer Mother     liver and colon  . Heart disease Paternal Grandmother     Social History Social History  Substance Use Topics  . Smoking status: Never Smoker  . Smokeless tobacco: Not on file  . Alcohol use No     Allergies   Review of patient's allergies indicates no known allergies.   Review of Systems Review of Systems  Constitutional: Positive for chills. Negative for fever.  HENT: Negative for rhinorrhea and sore throat.   Eyes: Negative for redness.  Respiratory: Negative for cough.   Cardiovascular: Negative for chest pain.  Gastrointestinal: Positive for abdominal pain and nausea. Negative for diarrhea and vomiting.  Genitourinary: Positive for flank pain. Negative for dysuria, hematuria, vaginal bleeding and vaginal discharge.  Musculoskeletal: Negative for myalgias.  Skin: Negative for rash.  Neurological: Negative for headaches.     Physical Exam Updated Vital Signs BP 126/85 (BP Location: Right Arm)  Pulse 110   Temp 98.3 F (36.8 C) (Oral)   Resp 17   Ht 5' (1.524 m)   Wt 81.6 kg   SpO2 96%   BMI 35.15 kg/m   Physical Exam  Constitutional: She appears well-developed and well-nourished.  HENT:  Head: Normocephalic and atraumatic.  Eyes: Conjunctivae are normal. Right eye exhibits no discharge. Left eye exhibits no discharge.  Neck: Normal range of motion. Neck supple.  Cardiovascular: Normal rate, regular rhythm and normal heart sounds.   Pulmonary/Chest: Effort normal and breath sounds normal.  Abdominal: Soft. She exhibits no mass. There is tenderness (mild, bilateral lower abd). There is no guarding and no CVA tenderness.  Neurological: She is alert.  Skin:  Skin is warm and dry.  Psychiatric: She has a normal mood and affect.  Nursing note and vitals reviewed.    ED Treatments / Results  Labs (all labs ordered are listed, but only abnormal results are displayed) Labs Reviewed  URINALYSIS, ROUTINE W REFLEX MICROSCOPIC (NOT AT [facility]) - Abnormal; Notable for the following:       Result Value   APPearance CLOUDY (*)    Hgb urine dipstick MODERATE (*)    Ketones, ur 15 (*)    Nitrite POSITIVE (*)    Leukocytes, UA MODERATE (*)    All other components within normal limits  CBC WITH DIFFERENTIAL/PLATELET - Abnormal; Notable for the following:    WBC 16.4 (*)    RBC 5.13 (*)    Hemoglobin 15.5 (*)    Neutro Abs 15.2 (*)    Lymphs Abs 0.5 (*)    All other components within normal limits  BASIC METABOLIC PANEL - Abnormal; Notable for the following:    Glucose, Bld 158 (*)    All other components within normal limits  URINE MICROSCOPIC-ADD ON - Abnormal; Notable for the following:    Squamous Epithelial / LPF 0-5 (*)    Bacteria, UA MANY (*)    All other components within normal limits  URINE CULTURE    EKG  EKG Interpretation None       Radiology Ct Renal Stone Study  Result Date: 01/21/2016 CLINICAL DATA:  Abdominal pain with nausea for 2 days. EXAM: CT ABDOMEN AND PELVIS WITHOUT CONTRAST TECHNIQUE: Multidetector CT imaging of the abdomen and pelvis was performed following the standard protocol without IV contrast. COMPARISON:  09/14/2010 FINDINGS: Lower chest:  Unremarkable. Hepatobiliary: 13 x 25 mm lesion identified in the dome of the liver previously is stable. Liver parenchyma otherwise has no evidence of focal abnormality. Gallbladder surgically absent. No intrahepatic or extrahepatic biliary dilation. Pancreas: No focal mass lesion. No dilatation of the main duct. No intraparenchymal cyst. No peripancreatic edema. Spleen: No splenomegaly. No focal mass lesion. Adrenals/Urinary Tract: No adrenal nodule or mass. 1-2 mm stone  identified in the upper pole of the right kidney (image 116 series 3) without hydronephrosis. No evidence for right ureteral stones although mild fullness is noted in the right ureter and there may be some subtle right periureteric stranding. Five nonobstructing stones are seen in the left kidney measuring up to 3-4 mm diameter. No left ureteral stones. 4 mm stone is identified in the dependent bladder lumen. Stomach/Bowel: Tiny hiatal hernia noted. Stomach otherwise normal in appearance. No small bowel wall thickening. No small bowel dilatation. The terminal ileum is normal. The appendix is normal. Diverticuli are seen scattered along the entire length of the colon without CT findings of diverticulitis. Vascular/Lymphatic: There is abdominal aortic atherosclerosis without  aneurysm. There is no gastrohepatic or hepatoduodenal ligament lymphadenopathy. No intraperitoneal or retroperitoneal lymphadenopathy. No pelvic sidewall lymphadenopathy. Reproductive: The uterus has normal CT imaging appearance. There is no adnexal mass. Other: No intraperitoneal free fluid. Musculoskeletal: Bone windows reveal no worrisome lytic or sclerotic osseous lesions. IMPRESSION: 1. 4 mm stone identified in the dependent bladder lumen. There are some apparent mild secondary changes in the right ureter suggesting passage in the right kidney. 2. Bilateral nonobstructing nephrolithiasis. 3. Abdominal aortic atherosclerosis. Electronically Signed   By: Misty Stanley M.D.   On: 01/21/2016 19:01    Procedures Procedures (including critical care time)  Medications Ordered in ED Medications  cefTRIAXone (ROCEPHIN) 1 g in dextrose 5 % 50 mL IVPB (1 g Intravenous New Bag/Given 01/21/16 1812)  sodium chloride 0.9 % bolus 500 mL (500 mLs Intravenous New Bag/Given 01/21/16 1811)     Initial Impression / Assessment and Plan / ED Course  I have reviewed the triage vital signs and the nursing notes.  Pertinent labs & imaging results that were  available during my care of the patient were reviewed by me and considered in my medical decision making (see chart for details).  Clinical Course  Comment By Time  Patient seen and examined. UA demonstrates UTI. Symptoms are not typical for simple cystitis. Will give IV antibiotics and perform renal CT to evaluate for stone, pyelonephritis, other problems. Pain currently controlled. Carlisle Cater, PA-C 08/07 1711   8:14 PM Patient seen earlier by Dr. Lita Mains.   Patient with probable passed stone into bladder. Elevated WBC, no fever, no persistent tachycardia. She has received IV antibiotics. She appears well and wants to go home.   Spoke with Dr. Jeffie Pollock. Advises abx, close follow-up.   Patient updated. Encouraged to return to the emergency department with worsening uncontrolled pain, uncontrolled vomiting, fevers, or other concerns. Patient verbalizes understanding and agrees with plan. She states that she is ready for discharge.  Final Clinical Impressions(s) / ED Diagnoses   Final diagnoses:  Flank pain  Acute cystitis without hematuria   Patient with probable ureteral stone, now in bladder. She has associated bladder infection, no fevers or vomiting to suggest pyelonephritis. She appears well, nontoxic in emergency department. Do not feel that patient requires admission for IV antibiotics at this point. Urology called for reccs and will provide close follow-up.  New Prescriptions New Prescriptions   CIPROFLOXACIN (CIPRO) 500 MG TABLET    Take 1 tablet (500 mg total) by mouth 2 (two) times daily.     Carlisle Cater, PA-C 01/21/16 2035    Julianne Rice, MD 01/22/16 [phone]

## 2016-01-21 NOTE — Progress Notes (Signed)
Patient confirms she does not have a pcp.  EDCm provided patient with a list of providers who accept Medicare insurance within a 15 mile radius of patient's zip code.  Patient thankful for services.  No further EDCM needs at this time.

## 2016-01-21 NOTE — Discharge Instructions (Signed)
Please read and follow all provided instructions.  Your diagnoses today include:  1. Flank pain   2. Acute cystitis without hematuria   3. Tension headache     Tests performed today include:  Urine test - suggests that you have an infection in your bladder  Blood counts - elevated Infection fighting cells  CT scan - stone noted in the bladder, stones noted in the kidneys, no stones causing blockage  Vital signs. See below for your results today.   Medications prescribed:   Ciprofloxacin - antibiotic  You have been prescribed an antibiotic medicine: take the entire course of medicine even if you are feeling better. Stopping early can cause the antibiotic not to work.  Home care instructions:  Follow any educational materials contained in this packet.  Follow-up instructions: Please follow-up with the urologist in 3 days if symptoms are not resolved for further evaluation of your symptoms.  Return instructions:   Please return to the Emergency Department if you experience worsening symptoms.   Return with fever, worsening pain, persistent vomiting, worsening pain in your back.   Please return if you have any other emergent concerns.  Additional Information:  Your vital signs today were: BP 135/75 (BP Location: Left Arm)    Pulse 76    Temp 98.3 F (36.8 C) (Oral)    Resp 20    Ht 5' (1.524 m)    Wt 81.6 kg    SpO2 96%    BMI 35.15 kg/m  If your blood pressure (BP) was elevated above 135/85 this visit, please have this repeated by your doctor within one month. --------------

## 2016-01-23 LAB — URINE CULTURE: Culture: >=100000 — AB

## 2016-01-24 ENCOUNTER — Telehealth (HOSPITAL_BASED_OUTPATIENT_CLINIC_OR_DEPARTMENT_OTHER): Payer: Self-pay

## 2016-01-24 NOTE — Telephone Encounter (Signed)
Post ED Visit - Positive Culture Follow-up  Culture report reviewed by antimicrobial stewardship pharmacist:  []  Elenor Quinones, Pharm.D. []  Heide Guile, Pharm.D., BCPS []  Parks Neptune, Pharm.D. []  Alycia Rossetti, Pharm.D., BCPS []  [place], Pharm.D., BCPS, AAHIVP []  Legrand Como, Pharm.D., BCPS, AAHIVP []  Milus Glazier, Pharm.D. []  Stephens November, Pharm.D. Arrie Senate Pharm D Positive urine culture Treated with Ciprofloxacin HCL, organism sensitive to the same and no further patient follow-up is required at this time.  Genia Del 01/24/2016, 9:58 AM

## 2016-01-28 DIAGNOSIS — R7309 Other abnormal glucose: Secondary | ICD-10-CM | POA: Diagnosis not present

## 2016-01-28 DIAGNOSIS — K219 Gastro-esophageal reflux disease without esophagitis: Secondary | ICD-10-CM | POA: Diagnosis not present

## 2016-01-28 DIAGNOSIS — R7303 Prediabetes: Secondary | ICD-10-CM | POA: Diagnosis not present

## 2016-01-28 DIAGNOSIS — E785 Hyperlipidemia, unspecified: Secondary | ICD-10-CM | POA: Diagnosis not present

## 2016-01-28 DIAGNOSIS — N200 Calculus of kidney: Secondary | ICD-10-CM | POA: Diagnosis not present

## 2016-01-29 DIAGNOSIS — N200 Calculus of kidney: Secondary | ICD-10-CM | POA: Diagnosis not present

## 2016-01-29 DIAGNOSIS — N201 Calculus of ureter: Secondary | ICD-10-CM | POA: Diagnosis not present

## 2016-02-05 DIAGNOSIS — N201 Calculus of ureter: Secondary | ICD-10-CM | POA: Diagnosis not present

## 2016-03-06 DIAGNOSIS — N200 Calculus of kidney: Secondary | ICD-10-CM | POA: Diagnosis not present

## 2016-03-10 DIAGNOSIS — E119 Type 2 diabetes mellitus without complications: Secondary | ICD-10-CM | POA: Diagnosis not present

## 2016-03-10 DIAGNOSIS — Z7984 Long term (current) use of oral hypoglycemic drugs: Secondary | ICD-10-CM | POA: Diagnosis not present

## 2016-03-10 DIAGNOSIS — K219 Gastro-esophageal reflux disease without esophagitis: Secondary | ICD-10-CM | POA: Diagnosis not present

## 2016-03-13 ENCOUNTER — Ambulatory Visit (INDEPENDENT_AMBULATORY_CARE_PROVIDER_SITE_OTHER): Payer: Medicare Other | Admitting: Podiatry

## 2016-03-13 ENCOUNTER — Encounter: Payer: Self-pay | Admitting: Podiatry

## 2016-03-13 VITALS — BP 143/82 | HR 78 | Resp 14

## 2016-03-13 DIAGNOSIS — M79676 Pain in unspecified toe(s): Secondary | ICD-10-CM | POA: Diagnosis not present

## 2016-03-13 DIAGNOSIS — B351 Tinea unguium: Secondary | ICD-10-CM

## 2016-03-13 NOTE — Progress Notes (Signed)
Patient ID: Sheri Soto, female   DOB: 12/14/1945, 70 y.o.   MRN: [id number] Complaint:  Visit Type: Patient returns to my office for continued preventative foot care services. Complaint: Patient states" my nails have grown long and thick and become painful to walk and wear shoes" . The patient presents for preventative foot care services. No changes to ROS  Podiatric Exam: Vascular: dorsalis pedis and posterior tibial pulses are palpable bilateral. Capillary return is immediate. Temperature gradient is WNL. Skin turgor WNL  Sensorium: Normal Semmes Weinstein monofilament test. Normal tactile sensation bilaterally. Nail Exam: Pt has thick disfigured discolored nails with subungual debris noted  First and second toes right foot. Ulcer Exam: There is no evidence of ulcer or pre-ulcerative changes or infection. Orthopedic Exam: Muscle tone and strength are WNL. No limitations in general ROM. No crepitus or effusions noted. Foot type and digits show no abnormalities. Bony prominences are unremarkable. 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 Skin: No Porokeratosis. No infection or ulcers  Diagnosis:  Onychomycosis, , Pain in right toe, pain in left toes  Treatment & Plan Procedures and Treatment: Consent by patient was obtained for treatment procedures. The patient understood the discussion of treatment and procedures well. All questions were answered thoroughly reviewed. Debridement of mycotic and hypertrophic toenails, 1 through 5 bilateral and clearing of subungual debris. No ulceration, no infection noted.  Return Visit-Office Procedure: Patient instructed to return to the office for a follow up visit 3 months for continued evaluation and treatment.   Gardiner Barefoot DPM

## 2016-05-19 DIAGNOSIS — R0789 Other chest pain: Secondary | ICD-10-CM | POA: Diagnosis not present

## 2016-05-20 ENCOUNTER — Telehealth: Payer: Self-pay

## 2016-05-20 NOTE — Telephone Encounter (Signed)
SENT NOTES TO SCHEDULING 

## 2016-05-22 ENCOUNTER — Telehealth: Payer: Self-pay | Admitting: Cardiology

## 2016-05-22 NOTE — Telephone Encounter (Signed)
Received records from [place] for appointment on 06/17/16 with Dr Martinique.  Records given to [facility] (medical records) for Dr Doug Sou schedule on 06/17/16. lp

## 2016-06-04 DIAGNOSIS — F331 Major depressive disorder, recurrent, moderate: Secondary | ICD-10-CM | POA: Diagnosis not present

## 2016-06-12 ENCOUNTER — Ambulatory Visit (INDEPENDENT_AMBULATORY_CARE_PROVIDER_SITE_OTHER): Payer: Medicare Other | Admitting: Podiatry

## 2016-06-12 ENCOUNTER — Encounter: Payer: Self-pay | Admitting: Podiatry

## 2016-06-12 VITALS — Ht 60.5 in | Wt 163.0 lb

## 2016-06-12 DIAGNOSIS — M79676 Pain in unspecified toe(s): Secondary | ICD-10-CM

## 2016-06-12 DIAGNOSIS — B351 Tinea unguium: Secondary | ICD-10-CM

## 2016-06-12 NOTE — Progress Notes (Signed)
Patient ID: Sheri Soto, female   DOB: 03/30/1946, 70 y.o.   MRN: [id number] Complaint:  Visit Type: Patient returns to my office for continued preventative foot care services. Complaint: Patient states" my nails have grown long and thick and become painful to walk and wear shoes" . The patient presents for preventative foot care services. No changes to ROS  Podiatric Exam: Vascular: dorsalis pedis and posterior tibial pulses are palpable bilateral. Capillary return is immediate. Temperature gradient is WNL. Skin turgor WNL  Sensorium: Normal Semmes Weinstein monofilament test. Normal tactile sensation bilaterally. Nail Exam: Pt has thick disfigured discolored nails with subungual debris noted  First and second toes right foot. Ulcer Exam: There is no evidence of ulcer or pre-ulcerative changes or infection. Orthopedic Exam: Muscle tone and strength are WNL. No limitations in general ROM. No crepitus or effusions noted. Foot type and digits show no abnormalities. Bony prominences are unremarkable. 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 Skin: No Porokeratosis. No infection or ulcers  Diagnosis:  Onychomycosis, , Pain in right toe, pain in left toes  Treatment & Plan Procedures and Treatment: Consent by patient was obtained for treatment procedures. The patient understood the discussion of treatment and procedures well. All questions were answered thoroughly reviewed. Debridement of mycotic and hypertrophic toenails, 1 through 5 bilateral and clearing of subungual debris. No ulceration, no infection noted.  Return Visit-Office Procedure: Patient instructed to return to the office for a follow up visit 3 months for continued evaluation and treatment.   Gardiner Barefoot DPM

## 2016-06-15 NOTE — Progress Notes (Signed)
Cardiology Office Note    Date:  06/17/2016   ID:  Sheri, Soto [birth year], MRN [id number]  PCP:  Dineen Kid, MD  Cardiologist:  Eryck Negron [place], MD    History of Present Illness:  Sheri Soto is a 70 y.o. female seen at the request of Dr. Via for evaluation of chest pain. She has a history of prediabetes, dyslipidemia and obesity. She also has a family history of CAD with her father dying at age 90 with MI. She reports a 5-6 month history of chest pain. It is localized to the left upper anterior chest without radiation. It is described as a sharp, knife-like pain. It lasts 2 minutes then resolves. It is not related to activity or position. It is getting more severe. No associated dyspnea, diaphoresis, or nausea. She does have typical GERD symptoms but this is clearly different. She is very sedentary. No prior history of heart disease.     Past Medical History:  Diagnosis Date  . Abnormal Pap smear    5 YEARS AGO  . Anxiety   . Depression   . Diabetes mellitus type 2 in obese ([place])   . Dyslipidemia   . GERD (gastroesophageal reflux disease)   . Migraine   . Personal history of kidney stones     Past Surgical History:  Procedure Laterality Date  . [place]  . GALLBLADDER SURGERY     REMOVED  . TONSILLECTOMY    . TUBAL LIGATION     29 YEARS AGO    Current Medications: Outpatient Medications Prior to Visit  Medication Sig Dispense Refill  . calcium carbonate (TUMS - DOSED IN MG ELEMENTAL CALCIUM) 500 MG chewable tablet Chew 1 tablet by mouth daily as needed for indigestion or heartburn.    . doxylamine, Sleep, (UNISOM) 25 MG tablet Take 25 mg by mouth at bedtime as needed for sleep.    . metFORMIN (GLUCOPHAGE-XR) 500 MG 24 hr tablet     . omeprazole (PRILOSEC OTC) 20 MG tablet Take 20 mg by mouth daily as needed (indigestion).    . ciprofloxacin (CIPRO) 500 MG tablet Take 1 tablet (500 mg total) by mouth 2 (two) times daily. 20 tablet 0   No  facility-administered medications prior to visit.      Allergies:   Patient has no known allergies.   Social History   Social History  . Marital status: Married    Spouse name: N/A  . Number of children: 3  . Years of education: N/A   Occupational History  . Retired    Social History Main Topics  . Smoking status: Never Smoker  . Smokeless tobacco: Never Used  . Alcohol use No  . Drug use: No  . Sexual activity: Yes    Partners: Male    Birth control/ protection: Surgical   Other Topics Concern  . None   Social History Narrative   Married. Education: [employer]. Exercise: No.     Family History:  The patient's family history includes Cancer in her father and mother; Clotting disorder in her mother; Heart attack in her father; Heart disease in her father, mother, and paternal grandmother; Hypertension in her father and mother.   ROS:   Please see the history of present illness.    ROS All other systems reviewed and are negative.   PHYSICAL EXAM:   VS:  BP 136/80   Pulse 71   Ht 5' (1.524 m)   Wt 180  lb (81.6 kg)   BMI 35.15 kg/m    GEN: Well nourished, morbidly obese, in no acute distress  HEENT: normal  Neck: no JVD, carotid bruits, or masses Cardiac: RRR; no murmurs, rubs, or gallops,no edema  Respiratory:  clear to auscultation bilaterally, normal work of breathing GI: soft, nontender, nondistended, + BS MS: no deformity or atrophy  Skin: warm and dry, no rash Neuro:  Alert and Oriented x 3, Strength and sensation are intact Psych: euthymic mood, full affect  Wt Readings from Last 3 Encounters:  06/17/16 180 lb (81.6 kg)  06/12/16 163 lb (73.9 kg)  01/21/16 180 lb (81.6 kg)      Studies/Labs Reviewed:   EKG:  EKG is ordered today.  The ekg ordered today demonstrates NSR with LAD, nonspecific T wave abnormality. I have personally reviewed and interpreted this study.   Recent Labs: 01/21/2016: BUN 16; Creatinine, Ser 0.76; Hemoglobin 15.5;  Platelets 176; Potassium 3.7; Sodium 138   Lipid Panel    Component Value Date/Time   CHOL 228 (H) 04/18/2015 1051   TRIG 135 04/18/2015 1051   HDL 43 (L) 04/18/2015 1051   CHOLHDL 5.3 (H) 04/18/2015 1051   VLDL 27 04/18/2015 1051   LDLCALC 158 (H) 04/18/2015 1051    Additional studies/ records that were reviewed today include:  Labs dated 01/28/16: cholesterol 175, triglycerides 189, LDL 97, HDL 41. A1c 6.6% Dated 05/19/16: Glucose 142, CMET and CBC otherwise normal,  ASSESSMENT:    1. Essential hypertension, benign   2. Elevated cholesterol   3. Chest pain with moderate risk for cardiac etiology      PLAN:  In order of problems listed above:  1. Patient has atypical chest pain but has moderate risk for cardiac disease based on her risk factors. Recommend a Lexiscan myoview study to further stratify her risk. If this is a low risk study would focus on risk factor modification and increased aerobic activity. If higher risk may need to consider angiography.    Medication Adjustments/Labs and Tests Ordered: Current medicines are reviewed at length with the patient today.  Concerns regarding medicines are outlined above.  Medication changes, Labs and Tests ordered today are listed in the Patient Instructions below. Patient Instructions  We will schedule you for a nuclear stress test      Signed, Ignacia Gentzler Martinique, MD  06/17/2016 11:30 AM    [facility] [address], [place], [place], [address] [phone]

## 2016-06-17 ENCOUNTER — Encounter: Payer: Self-pay | Admitting: Cardiology

## 2016-06-17 ENCOUNTER — Ambulatory Visit (INDEPENDENT_AMBULATORY_CARE_PROVIDER_SITE_OTHER): Payer: Medicare Other | Admitting: Cardiology

## 2016-06-17 VITALS — BP 136/80 | HR 71 | Ht 60.0 in | Wt 180.0 lb

## 2016-06-17 DIAGNOSIS — I10 Essential (primary) hypertension: Secondary | ICD-10-CM

## 2016-06-17 DIAGNOSIS — E7800 Pure hypercholesterolemia, unspecified: Secondary | ICD-10-CM

## 2016-06-17 DIAGNOSIS — R079 Chest pain, unspecified: Secondary | ICD-10-CM | POA: Insufficient documentation

## 2016-06-17 NOTE — Patient Instructions (Signed)
We will schedule you for a nuclear stress test   

## 2016-06-20 ENCOUNTER — Telehealth (HOSPITAL_COMMUNITY): Payer: Self-pay

## 2016-06-20 NOTE — Telephone Encounter (Signed)
Encounter complete. 

## 2016-06-24 ENCOUNTER — Ambulatory Visit (HOSPITAL_COMMUNITY)
Admission: RE | Admit: 2016-06-24 | Discharge: 2016-06-24 | Disposition: A | Discharge status: Home / Self Care | Payer: Medicare Other | Source: Ambulatory Visit | Attending: Cardiology | Admitting: Cardiology

## 2016-06-24 DIAGNOSIS — E669 Obesity, unspecified: Secondary | ICD-10-CM | POA: Insufficient documentation

## 2016-06-24 DIAGNOSIS — E119 Type 2 diabetes mellitus without complications: Secondary | ICD-10-CM | POA: Diagnosis not present

## 2016-06-24 DIAGNOSIS — R079 Chest pain, unspecified: Secondary | ICD-10-CM | POA: Diagnosis not present

## 2016-06-24 DIAGNOSIS — E7800 Pure hypercholesterolemia, unspecified: Secondary | ICD-10-CM

## 2016-06-24 DIAGNOSIS — I10 Essential (primary) hypertension: Secondary | ICD-10-CM | POA: Diagnosis not present

## 2016-06-24 LAB — MYOCARDIAL PERFUSION IMAGING
CHL CUP NUCLEAR SRS: 0
CHL CUP NUCLEAR SSS: 3
CSEPPHR: 85 {beats}/min
LV dias vol: 61 mL (ref 46–106)
LV sys vol: 27 mL
Rest HR: 60 {beats}/min
SDS: 3
TID: 1.44

## 2016-06-24 MED ORDER — TECHNETIUM TC 99M TETROFOSMIN IV KIT
30.3000 | PACK | Freq: Once | INTRAVENOUS | Status: AC | PRN
  Administered 2016-06-24: 30.3 via INTRAVENOUS
  Filled 2016-06-24: qty 31

## 2016-06-24 MED ORDER — REGADENOSON 0.4 MG/5ML IV SOLN
0.4000 mg | Freq: Once | INTRAVENOUS | Status: AC
  Administered 2016-06-24: 0.4 mg via INTRAVENOUS

## 2016-06-24 MED ORDER — TECHNETIUM TC 99M TETROFOSMIN IV KIT
9.9000 | PACK | Freq: Once | INTRAVENOUS | Status: AC | PRN
  Administered 2016-06-24: 9.9 via INTRAVENOUS
  Filled 2016-06-24: qty 10

## 2016-07-04 DIAGNOSIS — F331 Major depressive disorder, recurrent, moderate: Secondary | ICD-10-CM | POA: Diagnosis not present

## 2016-09-04 ENCOUNTER — Encounter: Payer: Self-pay | Admitting: Podiatry

## 2016-09-04 ENCOUNTER — Ambulatory Visit (INDEPENDENT_AMBULATORY_CARE_PROVIDER_SITE_OTHER): Payer: Medicare Other | Admitting: Podiatry

## 2016-09-04 DIAGNOSIS — M79676 Pain in unspecified toe(s): Secondary | ICD-10-CM

## 2016-09-04 DIAGNOSIS — B351 Tinea unguium: Secondary | ICD-10-CM

## 2016-09-04 NOTE — Progress Notes (Signed)
Patient ID: STARLINA LAPRE, female   DOB: [birth year], 71 y.o.   MRN: [id number] Complaint:  Visit Type: Patient returns to my office for continued preventative foot care services. Complaint: Patient states" my nails have grown long and thick and become painful to walk and wear shoes" . The patient presents for preventative foot care services. No changes to ROS  Podiatric Exam: Vascular: dorsalis pedis and posterior tibial pulses are palpable bilateral. Capillary return is immediate. Temperature gradient is WNL. Skin turgor WNL  Sensorium: Normal Semmes Weinstein monofilament test. Normal tactile sensation bilaterally. Nail Exam: Pt has thick disfigured discolored nails with subungual debris noted  First and second toes right foot. Ulcer Exam: There is no evidence of ulcer or pre-ulcerative changes or infection. Orthopedic Exam: Muscle tone and strength are WNL. No limitations in general ROM. No crepitus or effusions noted. Foot type and digits show no abnormalities. Bony prominences are unremarkable. 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 DJD 1st MPJ  Right foot. Skin: No Porokeratosis. No infection or ulcers  Diagnosis:  Onychomycosis, , Pain in right toe, pain in left toes  Treatment & Plan Procedures and Treatment: Consent by patient was obtained for treatment procedures. The patient understood the discussion of treatment and procedures well. All questions were answered thoroughly reviewed. Debridement of mycotic and hypertrophic toenails, 1 through 5 bilateral and clearing of subungual debris. No ulceration, no infection noted.  Return Visit-Office Procedure: Patient instructed to return to the office for a follow up visit 3 months for continued evaluation and treatment.   Gardiner Barefoot DPM

## 2016-09-08 DIAGNOSIS — Z7984 Long term (current) use of oral hypoglycemic drugs: Secondary | ICD-10-CM | POA: Diagnosis not present

## 2016-09-08 DIAGNOSIS — E119 Type 2 diabetes mellitus without complications: Secondary | ICD-10-CM | POA: Diagnosis not present

## 2016-09-08 DIAGNOSIS — F331 Major depressive disorder, recurrent, moderate: Secondary | ICD-10-CM | POA: Diagnosis not present

## 2016-10-29 DIAGNOSIS — F331 Major depressive disorder, recurrent, moderate: Secondary | ICD-10-CM | POA: Diagnosis not present

## 2016-11-12 DIAGNOSIS — F331 Major depressive disorder, recurrent, moderate: Secondary | ICD-10-CM | POA: Diagnosis not present

## 2016-11-12 DIAGNOSIS — F5101 Primary insomnia: Secondary | ICD-10-CM | POA: Diagnosis not present

## 2016-11-28 ENCOUNTER — Encounter: Payer: Self-pay | Admitting: Podiatry

## 2016-11-28 ENCOUNTER — Ambulatory Visit (INDEPENDENT_AMBULATORY_CARE_PROVIDER_SITE_OTHER): Payer: Medicare Other | Admitting: Podiatry

## 2016-11-28 DIAGNOSIS — B351 Tinea unguium: Secondary | ICD-10-CM

## 2016-11-28 DIAGNOSIS — F5101 Primary insomnia: Secondary | ICD-10-CM | POA: Diagnosis not present

## 2016-11-28 DIAGNOSIS — M79676 Pain in unspecified toe(s): Secondary | ICD-10-CM

## 2016-11-28 DIAGNOSIS — F331 Major depressive disorder, recurrent, moderate: Secondary | ICD-10-CM | POA: Diagnosis not present

## 2016-11-28 NOTE — Progress Notes (Signed)
Patient ID: Sheri Soto, female   DOB: 1945/12/30, 71 y.o.   MRN: [id number] Complaint:  Visit Type: Patient returns to my office for continued preventative foot care services. Complaint: Patient states" my nails have grown long and thick and become painful to walk and wear shoes" . The patient presents for preventative foot care services. No changes to ROS  Podiatric Exam: Vascular: dorsalis pedis and posterior tibial pulses are palpable bilateral. Capillary return is immediate. Temperature gradient is WNL. Skin turgor WNL  Sensorium: Normal Semmes Weinstein monofilament test. Normal tactile sensation bilaterally. Nail Exam: Pt has thick disfigured discolored nails with subungual debris noted  First and second toes right foot. Ulcer Exam: There is no evidence of ulcer or pre-ulcerative changes or infection. Orthopedic Exam: Muscle tone and strength are WNL. No limitations in general ROM. No crepitus or effusions noted. Foot type and digits show no abnormalities. Bony prominences are unremarkable. 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 DJD 1st MPJ  Right foot. Skin: No Porokeratosis. No infection or ulcers  Diagnosis:  Onychomycosis, , Pain in right toe, pain in left toes  Treatment & Plan Procedures and Treatment: Consent by patient was obtained for treatment procedures. The patient understood the discussion of treatment and procedures well. All questions were answered thoroughly reviewed. Debridement of mycotic and hypertrophic toenails, 1 through 5 bilateral and clearing of subungual debris. No ulceration, no infection noted.  Return Visit-Office Procedure: Patient instructed to return to the office for a follow up visit 3 months for continued evaluation and treatment.   Gardiner Barefoot DPM

## 2016-12-12 DIAGNOSIS — F331 Major depressive disorder, recurrent, moderate: Secondary | ICD-10-CM | POA: Diagnosis not present

## 2016-12-16 DIAGNOSIS — N200 Calculus of kidney: Secondary | ICD-10-CM | POA: Diagnosis not present

## 2017-02-11 DIAGNOSIS — F331 Major depressive disorder, recurrent, moderate: Secondary | ICD-10-CM | POA: Diagnosis not present

## 2017-02-11 DIAGNOSIS — Z1211 Encounter for screening for malignant neoplasm of colon: Secondary | ICD-10-CM | POA: Diagnosis not present

## 2017-02-11 DIAGNOSIS — F5101 Primary insomnia: Secondary | ICD-10-CM | POA: Diagnosis not present

## 2017-03-06 ENCOUNTER — Ambulatory Visit (INDEPENDENT_AMBULATORY_CARE_PROVIDER_SITE_OTHER): Payer: Medicare Other | Admitting: Podiatry

## 2017-03-06 ENCOUNTER — Encounter: Payer: Self-pay | Admitting: Podiatry

## 2017-03-06 DIAGNOSIS — M79676 Pain in unspecified toe(s): Secondary | ICD-10-CM | POA: Diagnosis not present

## 2017-03-06 DIAGNOSIS — B351 Tinea unguium: Secondary | ICD-10-CM | POA: Diagnosis not present

## 2017-03-06 DIAGNOSIS — M76821 Posterior tibial tendinitis, right leg: Secondary | ICD-10-CM

## 2017-03-06 NOTE — Progress Notes (Signed)
Patient ID: Sheri Soto, female   DOB: 1945/08/29, 71 y.o.   MRN: [id number] Complaint:  Visit Type: Patient returns to my office for continued preventative foot care services. Complaint: Patient states" my nails have grown long and thick and become painful to walk and wear shoes" . The patient presents for preventative foot care services. No changes to ROS.  Patient also relates pain in right ankle with swelling with pain when walking.  Podiatric Exam: Vascular: dorsalis pedis and posterior tibial pulses are palpable bilateral. Capillary return is immediate. Temperature gradient is WNL. Skin turgor WNL  Sensorium: Normal Semmes Weinstein monofilament test. Normal tactile sensation bilaterally. Nail Exam: Pt has thick disfigured discolored nails with subungual debris noted  First and second toes right foot. Ulcer Exam: There is no evidence of ulcer or pre-ulcerative changes or infection. Orthopedic Exam: Muscle tone and strength are WNL. No limitations in general ROM. No crepitus or effusions noted. Foot type and digits show no abnormalities. Bony prominences are unremarkable. 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 DJD 1st MPJ  Right foot.  PTTD right with end stage DJD. Skin: No Porokeratosis. No infection or ulcers  Diagnosis:  Onychomycosis, , Pain in right toe, pain in left toes,  PTTD  Treatment & Plan Procedures and Treatment: Consent by patient was obtained for treatment procedures. The patient understood the discussion of treatment and procedures well. All questions were answered thoroughly reviewed. Debridement of mycotic and hypertrophic toenails, 1 through 5 bilateral and clearing of subungual debris. No ulceration, no infection noted.  Return Visit-Office Procedure: Patient instructed to return to the office for a follow up visit 3 months for continued evaluation and treatment.   Gardiner Barefoot DPM

## 2017-03-16 ENCOUNTER — Ambulatory Visit: Payer: Medicare Other | Admitting: Orthotics

## 2017-03-16 DIAGNOSIS — M76821 Posterior tibial tendinitis, right leg: Secondary | ICD-10-CM

## 2017-03-16 DIAGNOSIS — B351 Tinea unguium: Secondary | ICD-10-CM

## 2017-03-16 DIAGNOSIS — M79676 Pain in unspecified toe(s): Principal | ICD-10-CM

## 2017-03-16 NOTE — Progress Notes (Signed)
Patient presents today for evaluation/casting for AFO brace (R).   Patient has hx of the following conditions: Gait instability,  Ankle instabilty, DJD, OA, hx of sprain Gait analysis done and patient displays abnormality of gait in both sagittial and frontal planes, and could benefit in aggressive ankle support.  Patient chose [place] brace w/ lace/speed laces.

## 2017-03-19 DIAGNOSIS — E785 Hyperlipidemia, unspecified: Secondary | ICD-10-CM | POA: Diagnosis not present

## 2017-03-19 DIAGNOSIS — Z7984 Long term (current) use of oral hypoglycemic drugs: Secondary | ICD-10-CM | POA: Diagnosis not present

## 2017-03-19 DIAGNOSIS — K219 Gastro-esophageal reflux disease without esophagitis: Secondary | ICD-10-CM | POA: Diagnosis not present

## 2017-03-19 DIAGNOSIS — E1165 Type 2 diabetes mellitus with hyperglycemia: Secondary | ICD-10-CM | POA: Diagnosis not present

## 2017-03-19 DIAGNOSIS — F331 Major depressive disorder, recurrent, moderate: Secondary | ICD-10-CM | POA: Diagnosis not present

## 2017-03-19 DIAGNOSIS — Z23 Encounter for immunization: Secondary | ICD-10-CM | POA: Diagnosis not present

## 2017-03-19 DIAGNOSIS — Z0000 Encounter for general adult medical examination without abnormal findings: Secondary | ICD-10-CM | POA: Diagnosis not present

## 2017-03-19 DIAGNOSIS — F5101 Primary insomnia: Secondary | ICD-10-CM | POA: Diagnosis not present

## 2017-04-13 ENCOUNTER — Ambulatory Visit (INDEPENDENT_AMBULATORY_CARE_PROVIDER_SITE_OTHER): Payer: Medicare Other | Admitting: Orthotics

## 2017-04-13 DIAGNOSIS — M76821 Posterior tibial tendinitis, right leg: Secondary | ICD-10-CM | POA: Diagnosis not present

## 2017-04-13 NOTE — Progress Notes (Signed)
Patient  came in to pick up [place] brace w/ dorsi assist springs.  The brace fit well and immediately patient's  gait approved.  The brace provided desired m-l stability in frontal/transverse planes and aided in dorsiflexion in saggital plane. Patient was able to don and doff brace with minimal difficulty.  Overall patient pleased with fit and functionality of brace.  

## 2017-05-19 DIAGNOSIS — F331 Major depressive disorder, recurrent, moderate: Secondary | ICD-10-CM | POA: Diagnosis not present

## 2017-05-19 DIAGNOSIS — F5101 Primary insomnia: Secondary | ICD-10-CM | POA: Diagnosis not present

## 2017-05-27 DIAGNOSIS — S5012XA Contusion of left forearm, initial encounter: Secondary | ICD-10-CM | POA: Diagnosis not present

## 2017-06-05 ENCOUNTER — Encounter: Payer: Self-pay | Admitting: Podiatry

## 2017-06-05 ENCOUNTER — Ambulatory Visit (INDEPENDENT_AMBULATORY_CARE_PROVIDER_SITE_OTHER): Payer: Medicare Other | Admitting: Podiatry

## 2017-06-05 DIAGNOSIS — M79676 Pain in unspecified toe(s): Secondary | ICD-10-CM

## 2017-06-05 DIAGNOSIS — B351 Tinea unguium: Secondary | ICD-10-CM | POA: Diagnosis not present

## 2017-06-05 NOTE — Progress Notes (Signed)
Patient ID: Sheri Soto, female   DOB: January 15, 1946, 71 y.o.   MRN: [id number] Complaint:  Visit Type: Patient returns to my office for continued preventative foot care services. Complaint: Patient states" my nails have grown long and thick and become painful to walk and wear shoes" . The patient presents for preventative foot care services. No changes to ROS.  Patient also relates pain in right ankle with swelling with pain when walking.  Podiatric Exam: Vascular: dorsalis pedis and posterior tibial pulses are palpable bilateral. Capillary return is immediate. Temperature gradient is WNL. Skin turgor WNL  Sensorium: Normal Semmes Weinstein monofilament test. Normal tactile sensation bilaterally. Nail Exam: Pt has thick disfigured discolored nails with subungual debris noted  First and second toes right foot. Ulcer Exam: There is no evidence of ulcer or pre-ulcerative changes or infection. Orthopedic Exam: Muscle tone and strength are WNL. No limitations in general ROM. No crepitus or effusions noted. Foot type and digits show no abnormalities. Bony prominences are unremarkable. 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 DJD 1st MPJ  Right foot.  PTTD right with end stage DJD. Skin: No Porokeratosis. No infection or ulcers  Diagnosis:  Onychomycosis, , Pain in right toe, pain in left toes,  PTTD  Treatment & Plan Procedures and Treatment: Consent by patient was obtained for treatment procedures. The patient understood the discussion of treatment and procedures well. All questions were answered thoroughly reviewed. Debridement of mycotic and hypertrophic toenails, 1 through 5 bilateral and clearing of subungual debris. No ulceration, no infection noted.  Return Visit-Office Procedure: Patient instructed to return to the office for a follow up visit 3 months for continued evaluation and treatment.   Gardiner Barefoot DPM

## 2017-07-21 DIAGNOSIS — R109 Unspecified abdominal pain: Secondary | ICD-10-CM | POA: Diagnosis not present

## 2017-07-21 DIAGNOSIS — N202 Calculus of kidney with calculus of ureter: Secondary | ICD-10-CM | POA: Diagnosis not present

## 2017-07-21 DIAGNOSIS — R1111 Vomiting without nausea: Secondary | ICD-10-CM | POA: Diagnosis not present

## 2017-08-04 DIAGNOSIS — N201 Calculus of ureter: Secondary | ICD-10-CM | POA: Diagnosis not present

## 2017-09-04 ENCOUNTER — Ambulatory Visit (INDEPENDENT_AMBULATORY_CARE_PROVIDER_SITE_OTHER): Payer: Medicare Other | Admitting: Podiatry

## 2017-09-04 ENCOUNTER — Encounter: Payer: Self-pay | Admitting: Podiatry

## 2017-09-04 DIAGNOSIS — M79676 Pain in unspecified toe(s): Secondary | ICD-10-CM

## 2017-09-04 DIAGNOSIS — B351 Tinea unguium: Secondary | ICD-10-CM

## 2017-09-04 NOTE — Progress Notes (Signed)
Patient ID: Sheri Soto, female   DOB: [birth year], 72 y.o.   MRN: [id number] Complaint:  Visit Type: Patient returns to my office for continued preventative foot care services. Complaint: Patient states" my nails have grown long and thick and become painful to walk and wear shoes" . The patient presents for preventative foot care services. No changes to ROS.  Patient also relates pain in right ankle with swelling with pain when walking.  Podiatric Exam: Vascular: dorsalis pedis and posterior tibial pulses are palpable bilateral. Capillary return is immediate. Temperature gradient is WNL. Skin turgor WNL  Sensorium: Normal Semmes Weinstein monofilament test. Normal tactile sensation bilaterally. Nail Exam: Pt has thick disfigured discolored nails with subungual debris noted  First and second toes right foot. Ulcer Exam: There is no evidence of ulcer or pre-ulcerative changes or infection. Orthopedic Exam: Muscle tone and strength are WNL. No limitations in general ROM. No crepitus or effusions noted. Foot type and digits show no abnormalities. Bony prominences are unremarkable. 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 DJD 1st MPJ  Right foot.  PTTD right with end stage DJD. Skin: No Porokeratosis. No infection or ulcers  Diagnosis:  Onychomycosis, , Pain in right toe, pain in left toes,  PTTD  Treatment & Plan Procedures and Treatment: Consent by patient was obtained for treatment procedures. The patient understood the discussion of treatment and procedures well. All questions were answered thoroughly reviewed. Debridement of mycotic and hypertrophic toenails, 1 through 5 bilateral and clearing of subungual debris. No ulceration, no infection noted.  Return Visit-Office Procedure: Patient instructed to return to the office for a follow up visit 3 months for continued evaluation and treatment.   Gardiner Barefoot DPM

## 2017-09-18 ENCOUNTER — Other Ambulatory Visit: Payer: Self-pay | Admitting: Family Medicine

## 2017-09-18 DIAGNOSIS — C44311 Basal cell carcinoma of skin of nose: Secondary | ICD-10-CM | POA: Diagnosis not present

## 2017-09-18 DIAGNOSIS — F331 Major depressive disorder, recurrent, moderate: Secondary | ICD-10-CM | POA: Diagnosis not present

## 2017-09-18 DIAGNOSIS — E119 Type 2 diabetes mellitus without complications: Secondary | ICD-10-CM | POA: Diagnosis not present

## 2017-09-18 DIAGNOSIS — F5101 Primary insomnia: Secondary | ICD-10-CM | POA: Diagnosis not present

## 2017-09-18 DIAGNOSIS — K219 Gastro-esophageal reflux disease without esophagitis: Secondary | ICD-10-CM | POA: Diagnosis not present

## 2017-09-18 DIAGNOSIS — E785 Hyperlipidemia, unspecified: Secondary | ICD-10-CM | POA: Diagnosis not present

## 2017-10-07 DIAGNOSIS — C44311 Basal cell carcinoma of skin of nose: Secondary | ICD-10-CM | POA: Diagnosis not present

## 2017-10-07 DIAGNOSIS — Z85828 Personal history of other malignant neoplasm of skin: Secondary | ICD-10-CM | POA: Diagnosis not present

## 2017-10-26 DIAGNOSIS — Z85828 Personal history of other malignant neoplasm of skin: Secondary | ICD-10-CM | POA: Diagnosis not present

## 2017-10-26 DIAGNOSIS — C44311 Basal cell carcinoma of skin of nose: Secondary | ICD-10-CM | POA: Diagnosis not present

## 2017-12-04 ENCOUNTER — Encounter: Payer: Self-pay | Admitting: Podiatry

## 2017-12-04 ENCOUNTER — Ambulatory Visit (INDEPENDENT_AMBULATORY_CARE_PROVIDER_SITE_OTHER): Payer: Medicare Other | Admitting: Podiatry

## 2017-12-04 DIAGNOSIS — M79676 Pain in unspecified toe(s): Secondary | ICD-10-CM

## 2017-12-04 DIAGNOSIS — B351 Tinea unguium: Secondary | ICD-10-CM | POA: Diagnosis not present

## 2017-12-04 NOTE — Progress Notes (Signed)
Patient ID: Sheri Soto, female   DOB: 05/27/1946, 72 y.o.   MRN: [id number] Complaint:  Visit Type: Patient returns to my office for continued preventative foot care services. Complaint: Patient states" my nails have grown long and thick and become painful to walk and wear shoes" . The patient presents for preventative foot care services. No changes to ROS.  Patient also relates pain in right ankle with swelling with pain when walking.  Podiatric Exam: Vascular: dorsalis pedis and posterior tibial pulses are palpable bilateral. Capillary return is immediate. Temperature gradient is WNL. Skin turgor WNL  Sensorium: Normal Semmes Weinstein monofilament test. Normal tactile sensation bilaterally. Nail Exam: Pt has thick disfigured discolored nails with subungual debris noted  First and second toes right foot. Ulcer Exam: There is no evidence of ulcer or pre-ulcerative changes or infection. Orthopedic Exam: Muscle tone and strength are WNL. No limitations in general ROM. No crepitus or effusions noted. Foot type and digits show no abnormalities. Bony prominences are unremarkable. 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 DJD 1st MPJ  Right foot.  PTTD right with end stage DJD. Skin: No Porokeratosis. No infection or ulcers  Diagnosis:  Onychomycosis, , Pain in right toe, pain in left toes,  PTTD  Treatment & Plan Procedures and Treatment: Consent by patient was obtained for treatment procedures. The patient understood the discussion of treatment and procedures well. All questions were answered thoroughly reviewed. Debridement of mycotic and hypertrophic toenails, 1 through 5 bilateral and clearing of subungual debris. No ulceration, no infection noted.  Return Visit-Office Procedure: Patient instructed to return to the office for a follow up visit 3 months for continued evaluation and treatment.   Gardiner Barefoot DPM

## 2018-03-12 ENCOUNTER — Encounter: Payer: Self-pay | Admitting: Podiatry

## 2018-03-12 ENCOUNTER — Ambulatory Visit (INDEPENDENT_AMBULATORY_CARE_PROVIDER_SITE_OTHER): Payer: Medicare Other | Admitting: Podiatry

## 2018-03-12 DIAGNOSIS — B351 Tinea unguium: Secondary | ICD-10-CM | POA: Diagnosis not present

## 2018-03-12 DIAGNOSIS — M79676 Pain in unspecified toe(s): Secondary | ICD-10-CM | POA: Diagnosis not present

## 2018-03-12 DIAGNOSIS — M76821 Posterior tibial tendinitis, right leg: Secondary | ICD-10-CM

## 2018-03-12 NOTE — Progress Notes (Signed)
Patient ID: Sheri Soto, female   DOB: 04-11-1946, 72 y.o.   MRN: [id number] Complaint:  Visit Type: Patient returns to my office for continued preventative foot care services. Complaint: Patient states" my nails have grown long and thick and become painful to walk and wear shoes" . The patient presents for preventative foot care services. No changes to ROS.  Patient also relates pain in right ankle with swelling with pain when walking.  Podiatric Exam: Vascular: dorsalis pedis and posterior tibial pulses are palpable bilateral. Capillary return is immediate. Temperature gradient is WNL. Skin turgor WNL  Sensorium: Normal Semmes Weinstein monofilament test. Normal tactile sensation bilaterally. Nail Exam: Pt has thick disfigured discolored nails with subungual debris noted  First and second toes right foot. Ulcer Exam: There is no evidence of ulcer or pre-ulcerative changes or infection. Orthopedic Exam: Muscle tone and strength are WNL. No limitations in general ROM. No crepitus or effusions noted. Foot type and digits show no abnormalities. Bony prominences are unremarkable. 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 DJD 1st MPJ  Right foot.  PTTD right with end stage DJD. Skin: No Porokeratosis. No infection or ulcers  Diagnosis:  Onychomycosis, , Pain in right toe, pain in left toes,  PTTD  Treatment & Plan Procedures and Treatment: Consent by patient was obtained for treatment procedures. The patient understood the discussion of treatment and procedures well. All questions were answered thoroughly reviewed. Debridement of mycotic and hypertrophic toenails, 1 through 5 bilateral and clearing of subungual debris. No ulceration, no infection noted.  Return Visit-Office Procedure: Patient instructed to return to the office for a follow up visit 3 months for continued evaluation and treatment.   Gardiner Barefoot DPM

## 2018-03-23 DIAGNOSIS — F331 Major depressive disorder, recurrent, moderate: Secondary | ICD-10-CM | POA: Diagnosis not present

## 2018-03-23 DIAGNOSIS — F5101 Primary insomnia: Secondary | ICD-10-CM | POA: Diagnosis not present

## 2018-03-23 DIAGNOSIS — E119 Type 2 diabetes mellitus without complications: Secondary | ICD-10-CM | POA: Diagnosis not present

## 2018-06-18 ENCOUNTER — Encounter: Payer: Self-pay | Admitting: Podiatry

## 2018-06-18 ENCOUNTER — Ambulatory Visit (INDEPENDENT_AMBULATORY_CARE_PROVIDER_SITE_OTHER): Payer: Medicare Other | Admitting: Podiatry

## 2018-06-18 DIAGNOSIS — B351 Tinea unguium: Secondary | ICD-10-CM

## 2018-06-18 DIAGNOSIS — M79676 Pain in unspecified toe(s): Secondary | ICD-10-CM | POA: Diagnosis not present

## 2018-06-18 NOTE — Progress Notes (Signed)
Patient ID: Sheri Soto, female   DOB: 01/26/1946, 72 y.o.   MRN: [id number] Complaint:  Visit Type: Patient returns to my office for continued preventative foot care services. Complaint: Patient states" my nails have grown long and thick and become painful to walk and wear shoes" . The patient presents for preventative foot care services. No changes to ROS.  Patient also relates pain in right ankle with swelling with pain when walking.  Podiatric Exam: Vascular: dorsalis pedis and posterior tibial pulses are palpable bilateral. Capillary return is immediate. Temperature gradient is WNL. Skin turgor WNL  Sensorium: Normal Semmes Weinstein monofilament test. Normal tactile sensation bilaterally. Nail Exam: Pt has thick disfigured discolored nails with subungual debris noted  First and second toes right foot. Ulcer Exam: There is no evidence of ulcer or pre-ulcerative changes or infection. Orthopedic Exam: Muscle tone and strength are WNL. No limitations in general ROM. No crepitus or effusions noted. Foot type and digits show no abnormalities. Bony prominences are unremarkable. 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 DJD 1st MPJ  Right foot.  PTTD right with end stage DJD. Skin: No Porokeratosis. No infection or ulcers  Diagnosis:  Onychomycosis, , Pain in right toe, pain in left toes,  PTTD  Treatment & Plan Procedures and Treatment: Consent by patient was obtained for treatment procedures. The patient understood the discussion of treatment and procedures well. All questions were answered thoroughly reviewed. Debridement of mycotic and hypertrophic toenails, 1 through 5 bilateral and clearing of subungual debris. No ulceration, no infection noted.  ABN signed for 2020. Return Visit-Office Procedure: Patient instructed to return to the office for a follow up visit 3 months for continued evaluation and treatment.   Joslin Doell DPM 

## 2018-06-24 ENCOUNTER — Emergency Department (HOSPITAL_COMMUNITY)
Admission: EM | Admit: 2018-06-24 | Discharge: 2018-06-24 | Disposition: A | Discharge status: Home / Self Care | Payer: Medicare Other | Attending: Emergency Medicine | Admitting: Emergency Medicine

## 2018-06-24 ENCOUNTER — Other Ambulatory Visit: Payer: Self-pay

## 2018-06-24 ENCOUNTER — Encounter (HOSPITAL_COMMUNITY): Payer: Self-pay

## 2018-06-24 ENCOUNTER — Emergency Department (HOSPITAL_COMMUNITY): Payer: Medicare Other

## 2018-06-24 DIAGNOSIS — F331 Major depressive disorder, recurrent, moderate: Secondary | ICD-10-CM | POA: Diagnosis not present

## 2018-06-24 DIAGNOSIS — Z79899 Other long term (current) drug therapy: Secondary | ICD-10-CM | POA: Diagnosis not present

## 2018-06-24 DIAGNOSIS — E785 Hyperlipidemia, unspecified: Secondary | ICD-10-CM | POA: Diagnosis not present

## 2018-06-24 DIAGNOSIS — E119 Type 2 diabetes mellitus without complications: Secondary | ICD-10-CM | POA: Insufficient documentation

## 2018-06-24 DIAGNOSIS — R9431 Abnormal electrocardiogram [ECG] [EKG]: Secondary | ICD-10-CM | POA: Diagnosis not present

## 2018-06-24 DIAGNOSIS — R0789 Other chest pain: Secondary | ICD-10-CM | POA: Insufficient documentation

## 2018-06-24 DIAGNOSIS — Z7984 Long term (current) use of oral hypoglycemic drugs: Secondary | ICD-10-CM | POA: Insufficient documentation

## 2018-06-24 DIAGNOSIS — F5101 Primary insomnia: Secondary | ICD-10-CM | POA: Diagnosis not present

## 2018-06-24 DIAGNOSIS — R079 Chest pain, unspecified: Secondary | ICD-10-CM | POA: Diagnosis not present

## 2018-06-24 DIAGNOSIS — E1169 Type 2 diabetes mellitus with other specified complication: Secondary | ICD-10-CM | POA: Diagnosis not present

## 2018-06-24 DIAGNOSIS — K219 Gastro-esophageal reflux disease without esophagitis: Secondary | ICD-10-CM | POA: Diagnosis not present

## 2018-06-24 LAB — CBC
HEMATOCRIT: 46.6 % — AB (ref 36.0–46.0)
HEMOGLOBIN: 14.8 g/dL (ref 12.0–15.0)
MCH: 29 pg (ref 26.0–34.0)
MCHC: 31.8 g/dL (ref 30.0–36.0)
MCV: 91.2 fL (ref 80.0–100.0)
NRBC: 0 % (ref 0.0–0.2)
PLATELETS: 211 10*3/uL (ref 150–400)
RBC: 5.11 MIL/uL (ref 3.87–5.11)
RDW: 13.7 % (ref 11.5–15.5)
WBC: 7.3 10*3/uL (ref 4.0–10.5)

## 2018-06-24 LAB — BASIC METABOLIC PANEL
ANION GAP: 8 (ref 5–15)
BUN: 8 mg/dL (ref 8–23)
CO2: 26 mmol/L (ref 22–32)
Calcium: 9.2 mg/dL (ref 8.9–10.3)
Chloride: 104 mmol/L (ref 98–111)
Creatinine, Ser: 0.87 mg/dL (ref 0.44–1.00)
GFR calc non Af Amer: >60 mL/min (ref >60)
Glucose, Bld: 122 mg/dL — ABNORMAL HIGH (ref 70–99)
POTASSIUM: 4 mmol/L (ref 3.5–5.1)
Sodium: 138 mmol/L (ref 135–145)

## 2018-06-24 LAB — I-STAT TROPONIN, ED
Troponin i, poc: 0 ng/mL (ref 0.00–0.08)
Troponin i, poc: 0 ng/mL (ref 0.00–0.08)

## 2018-06-24 NOTE — ED Notes (Signed)
Sheri Soto (son) [phone]

## 2018-06-24 NOTE — ED Provider Notes (Signed)
[facility] EMERGENCY DEPARTMENT Provider Note   CSN: [id number] Arrival date & time: 06/24/18  1204   History   Chief Complaint Chief Complaint  Patient presents with  . Chest Pain    HPI Sheri Soto is a 73 y.o. female with a PMH of Dyslipidemia, Diabetes type 2, anxiety, and GERD presenting with intermittent left sided chest pain onset 6 months ago. Patient describes pain as a stabbing and states it lasts 45 seconds. Pain resolves on its own. Patient reports associated light-headedness with chest pain. Patient states nothing makes the pain better or worse. Patient reports last episode was yesterday. Patient denies any current chest pain. Patient states she was advised to come to ER by PCP due to EKG changes. Denies DOE, SOB, chest tightness or pressure, radiation to left arm, jaw or back, nausea, or diaphoresis. Patient denies recent travel, history of PE/DVT, recent surgery,or  leg edema/pain. Patient reports she was evaluated by cardiology in 2018 by Dr. Collier Salina. Patient had a stress test in 06/2016 and results were normal. Patient denies tobacco, alcohol, or drug use. Patient reports father is deceased from an MI at 35 years old.     HPI  Past Medical History:  Diagnosis Date  . Abnormal Pap smear    5 YEARS AGO  . Anxiety   . Depression   . Diabetes mellitus type 2 in obese ([place])   . Dyslipidemia   . GERD (gastroesophageal reflux disease)   . Migraine   . Personal history of kidney stones     Patient Active Problem List   Diagnosis Date Noted  . Chest pain with moderate risk for cardiac etiology 06/17/2016  . Elevated cholesterol 03/14/2014  . Obesity 09/21/2012  . Essential hypertension, benign 09/21/2012  . Insomnia 04/15/2011  . Migraine 02/04/2011    Past Surgical History:  Procedure Laterality Date  . [place]  . GALLBLADDER SURGERY     REMOVED  . TONSILLECTOMY    . TUBAL LIGATION     29 YEARS AGO     OB History     No obstetric history on file.      Home Medications    Prior to Admission medications   Medication Sig Start Date End Date Taking? Authorizing Provider  atorvastatin (LIPITOR) 40 MG tablet Take 40 mg by mouth daily. 06/19/17   [provider]  buPROPion (WELLBUTRIN SR) 150 MG 12 hr tablet TAKE 1 TABLET BY MOUTH EVERY MORNING TWICE A DAY 08/13/17   [provider]  calcium carbonate (TUMS - DOSED IN MG ELEMENTAL CALCIUM) 500 MG chewable tablet Chew 1 tablet by mouth daily as needed for indigestion or heartburn.    [provider]  doxylamine, Sleep, (UNISOM) 25 MG tablet Take 25 mg by mouth at bedtime as needed for sleep.    [provider]  escitalopram (LEXAPRO) 10 MG tablet Take 10 mg by mouth daily.    [provider]  FLUAD 0.5 ML SUSY  02/05/18   [provider]  metFORMIN (GLUCOPHAGE-XR) 500 MG 24 hr tablet  02/21/16   [provider]  omeprazole (PRILOSEC OTC) 20 MG tablet Take 20 mg by mouth daily as needed (indigestion).    [provider]  omeprazole (PRILOSEC) 20 MG capsule TAKE 2 CAPSULES BY MOUTH ONCE A DAY 06/24/17   [provider]  zolpidem (AMBIEN) 10 MG tablet TAKE 1 TABLET BY MOUTH EVERY DAY AT BEDTIME AS NEEDED 08/30/17   [provider]    Family History Family History  Problem Relation Age of Onset  . Heart disease Father        MASSIVE HEART ATTACK  . Hypertension Father   . Cancer Father        colon cancer  . Heart attack Father   . Heart disease Mother   . Hypertension Mother   . Clotting disorder Mother   . Cancer Mother        liver and colon  . Heart disease Paternal Grandmother     Social History Social History   Tobacco Use  . Smoking status: Never Smoker  . Smokeless tobacco: Never Used  Substance Use Topics  . Alcohol use: No  . Drug use: No     Allergies   Patient has no known allergies.   Review of Systems Review of Systems  Constitutional:  Negative for activity change, appetite change, chills, diaphoresis, fatigue, fever and unexpected weight change.  HENT: Negative for congestion and rhinorrhea.   Respiratory: Negative for cough, chest tightness, shortness of breath and wheezing.   Cardiovascular: Positive for chest pain. Negative for palpitations and leg swelling.  Gastrointestinal: Negative for abdominal pain, nausea and vomiting.  Endocrine: Negative for cold intolerance and heat intolerance.  Musculoskeletal: Negative for back pain.  Skin: Negative for rash.  Allergic/Immunologic: Negative for immunocompromised state.  Neurological: Positive for light-headedness. Negative for dizziness, syncope and weakness.  Psychiatric/Behavioral: Negative for agitation and behavioral problems. The patient is not nervous/anxious.      Physical Exam Updated Vital Signs BP (!) 154/50   Pulse 62   Temp (!) 97.5 F (36.4 C) (Oral)   Resp 14   SpO2 96%   Physical Exam Vitals signs and nursing note reviewed.  Constitutional:      General: She is not in acute distress.    Appearance: She is well-developed. She is not diaphoretic.  HENT:     Head: Normocephalic and atraumatic.  Neck:     Musculoskeletal: Normal range of motion and neck supple.     Vascular: No JVD.  Cardiovascular:     Rate and Rhythm: Normal rate and regular rhythm.     Pulses: Normal pulses.          Radial pulses are 2+ on the right side and 2+ on the left side.       Dorsalis pedis pulses are 2+ on the right side and 2+ on the left side.     Heart sounds: Normal heart sounds. No murmur. No friction rub. No gallop.   Pulmonary:     Effort: Pulmonary effort is normal. No respiratory distress.     Breath sounds: Normal breath sounds. No wheezing or rales.  Chest:     Chest wall: No tenderness.  Abdominal:     Palpations: Abdomen is soft.     Tenderness: There is no abdominal tenderness.  Musculoskeletal: Normal range of motion.     Right lower leg: She  exhibits no tenderness. No edema.     Left lower leg: She exhibits no tenderness. No edema.  Skin:    General: Skin is warm.     Capillary Refill: Capillary refill takes less than 2 seconds.     Coloration: Skin is not pale.     Findings: No rash.  Neurological:     Mental Status: She is alert and oriented to person, place, and time.     Motor: No weakness.  Psychiatric:  Mood and Affect: Mood normal.        Behavior: Behavior normal.    ED Treatments / Results  Labs (all labs ordered are listed, but only abnormal results are displayed) Labs Reviewed  BASIC METABOLIC PANEL - Abnormal; Notable for the following components:      Result Value   Glucose, Bld 122 (*)    All other components within normal limits  CBC - Abnormal; Notable for the following components:   HCT 46.6 (*)    All other components within normal limits  I-STAT TROPONIN, ED  I-STAT TROPONIN, ED    EKG EKG Interpretation  Date/Time:  Thursday June 24 2018 12:09:54 EST Ventricular Rate:  77 PR Interval:  154 QRS Duration: 86 QT Interval:  384 QTC Calculation: 434 R Axis:   -46 Text Interpretation:  Normal sinus rhythm Left axis deviation Cannot rule out Anterior infarct , age undetermined Abnormal ECG no sig change from previous Confirmed by Charlesetta Shanks [id number]) on 06/24/2018 2:46:11 PM   Radiology Dg Chest 2 View  Result Date: 06/24/2018 CLINICAL DATA:  Left-sided chest pain for several months EXAM: CHEST - 2 VIEW COMPARISON:  07/11/2012 FINDINGS: The heart size and mediastinal contours are within normal limits. Both lungs are clear. The visualized skeletal structures are unremarkable. IMPRESSION: No active cardiopulmonary disease. Electronically Signed   By: Inez Catalina M.D.   On: 06/24/2018 13:16    Procedures Procedures (including critical care time)  Medications Ordered in ED Medications - No data to display   Initial Impression / Assessment and Plan / ED Course  I have reviewed  the triage vital signs and the nursing notes.  Pertinent labs & imaging results that were available during my care of the patient were reviewed by me and considered in my medical decision making (see chart for details).  Clinical Course as of Jun 24 1650  Thu Jun 24, 2018  1348 No active cardiopulmonary disease.  DG Chest 2 View [AH]  1507 BP elevated. Patient denies any chest pain, headaches, or other symptoms. Will advise patient to follow up with PCP regarding BP control.  BP(!): 154/50 [AH]  7371 Patient has been asymptomatic while in the ER.   [AH]    Clinical Course User Index [AH] Arville Lime, PA-C   Patient is to be discharged with recommendation to follow up with Cardiology/PCP in regards to today's hospital visit. Chest pain is not likely of cardiac or pulmonary etiology d/t presentation, VSS, no tracheal deviation, no JVD or new murmur, RRR, breath sounds equal bilaterally, EKG without acute abnormalities, negative troponin, and negative CXR. Heart score is at 3 and two sets of troponins were collected and negative. Patient has been asymptomatic while in the ER. Pt has been advised to return to the ED if CP becomes exertional, associated with diaphoresis or nausea, radiates to left jaw/arm, worsens or becomes concerning in any way. Pt appears reliable for follow up and is agreeable to discharge.   Case has been discussed with and seen by Dr. Melina Copa who agrees with the above plan to discharge.   Final Clinical Impressions(s) / ED Diagnoses   Final diagnoses:  Atypical chest pain    ED Discharge Orders    None       Arville Lime, [place] 06/24/18 1652    Hayden Rasmussen, MD 06/24/18 2320

## 2018-06-24 NOTE — ED Notes (Signed)
ED Provider at bedside. 

## 2018-06-24 NOTE — ED Notes (Signed)
Patient verbalizes understanding of discharge instructions. Opportunity for questioning and answers were provided. Armband removed by staff, pt discharged from ED. Follow up reviewed. Pt ambulatory.

## 2018-06-24 NOTE — ED Triage Notes (Signed)
Pt endorses intermittent left sided chest pain x 6 months. Sent here by her MD at [facility] after being seen. Denies any other sx. VSS.

## 2018-06-24 NOTE — Discharge Instructions (Addendum)
You have been seen today for chest pain. Please read and follow all provided instructions.   1. Medications: usual home medications 2. Treatment: rest, drink plenty of fluids 3. Follow Up: Please follow up with cardiology regarding further evaluation of chest pain. Please follow up with your primary doctor in 2 days for discussion of your diagnoses and further evaluation after today's visit; if you do not have a primary care doctor use the resource guide provided to find one; Please return to the ER for any new or worsening symptoms. Please obtain all of your results from medical records or have your doctors office obtain the results - share them with your doctor - you should be seen at your doctors office. Call today to arrange your follow up.   Take medications as prescribed. Please review all of the medicines and only take them if you do not have an allergy to them. Return to the emergency room for worsening condition or new concerning symptoms. Follow up with your regular doctor. If you don't have a regular doctor use one of the numbers below to establish a primary care doctor.  Please be aware that if you are taking birth control pills, taking other prescriptions, ESPECIALLY ANTIBIOTICS may make the birth control ineffective - if this is the case, either do not engage in sexual activity or use alternative methods of birth control such as condoms until you have finished the medicine and your family doctor says it is OK to restart them. If you are on a blood thinner such as COUMADIN, be aware that any other medicine that you take may cause the coumadin to either work too much, or not enough - you should have your coumadin level rechecked in next 7 days if this is the case.  ?  It is also a possibility that you have an allergic reaction to any of the medicines that you have been prescribed - Everybody reacts differently to medications and while MOST people have no trouble with most medicines, you may  have a reaction such as nausea, vomiting, rash, swelling, shortness of breath. If this is the case, please stop taking the medicine immediately and contact your physician.  ?  You should return to the ER if you develop severe or worsening symptoms.   Emergency Department Resource Guide 1) Find a Doctor and Pay Out of Pocket Although you won't have to find out who is covered by your insurance plan, it is a good idea to ask around and get recommendations. You will then need to call the office and see if the doctor you have chosen will accept you as a new patient and what types of options they offer for patients who are self-pay. Some doctors offer discounts or will set up payment plans for their patients who do not have insurance, but you will need to ask so you aren't surprised when you get to your appointment.  2) Contact Your Local Health Department Not all health departments have doctors that can see patients for sick visits, but many do, so it is worth a call to see if yours does. If you don't know where your local health department is, you can check in your phone book. The CDC also has a tool to help you locate your state's health department, and many state websites also have listings of all of their local health departments.  3) Find a [facility] If your illness is not likely to be very severe or complicated, you may want to  try a walk in clinic. These are popping up all over the country in pharmacies, drugstores, and shopping centers. They're usually staffed by nurse practitioners or physician assistants that have been trained to treat common illnesses and complaints. They're usually fairly quick and inexpensive. However, if you have serious medical issues or chronic medical problems, these are probably not your best option.  No Primary Care Doctor: Call Health Connect at  [phone] - they can help you locate a primary care doctor that  accepts your insurance, provides certain services,  etc. Physician Referral [phone]  Emergency Department Resource Guide 1) Find a Doctor and Pay Out of Pocket Although you won't have to find out who is covered by your insurance plan, it is a good idea to ask around and get recommendations. You will then need to call the office and see if the doctor you have chosen will accept you as a new patient and what types of options they offer for patients who are self-pay. Some doctors offer discounts or will set up payment plans for their patients who do not have insurance, but you will need to ask so you aren't surprised when you get to your appointment.  2) Contact Your Local Health Department Not all health departments have doctors that can see patients for sick visits, but many do, so it is worth a call to see if yours does. If you don't know where your local health department is, you can check in your phone book. The CDC also has a tool to help you locate your state's health department, and many state websites also have listings of all of their local health departments.  3) Find a [facility] If your illness is not likely to be very severe or complicated, you may want to try a walk in clinic. These are popping up all over the country in pharmacies, drugstores, and shopping centers. They're usually staffed by nurse practitioners or physician assistants that have been trained to treat common illnesses and complaints. They're usually fairly quick and inexpensive. However, if you have serious medical issues or chronic medical problems, these are probably not your best option.  No Primary Care Doctor: Call Health Connect at  [phone] - they can help you locate a primary care doctor that  accepts your insurance, provides certain services, etc. Physician Referral Service- [phone]  Chronic Pain Problems: Organization         Address  Phone   Notes  [facility]  [phone] Patients need to be referred by their  primary care doctor.   Medication Assistance: Organization         Address  Phone   Notes  [facility] Medication [facility] [address], [place] [address] [phone] --Must be a resident of [facility] -- Must have NO insurance coverage whatsoever (no Medicaid/ Medicare, etc.) -- The pt. MUST have a primary care doctor that directs their care regularly and follows them in the community   [facility]  [phone]   [employer]  [phone]    Agencies that provide inexpensive medical care: Organization         Address  Phone   Notes  [place]  [phone]   [facility]    [phone]   [facility] [place], [place] [address] [phone]   [place] [address] [place] [phone]  Planned Parenthood    [phone]   [facility]    [phone]   [facility] [place], [place] Phone:  [phone], Fax:  [phone] Hours of Operation:  9 am - 6 pm, M-F.  Also accepts Medicaid/Medicare and self-pay.  [facility] for [place], [address], [place] Phone: [phone], Fax: [phone]. Hours of Operation:  8:30 am - 5:30 pm, M-F.  Also accepts Medicaid and self-pay.  [facility] [place] [address], [place] Phone: [phone]   [place], [place], [place] [phone], [address]. [phone] Mondays & Thursdays: 7-9 AM.  First 15 patients are seen on a first come, first serve basis.    [place] Providers:  Organization         Address  Phone   Notes  [facility] [address],  [phone] Also accepts self-pay patients.  [facility] [address], [place]  [phone]   [place], [address], [place] [phone]   [facility] [address] [place] [phone]   Lucianne Lei [address], [place]   [phone] Only accepts [place] patients after they have their name applied to their card.   Self-Pay (no insurance) in [facility]:  Organization         Address  Phone   Notes  Sickle Cell Patients, [facility] [place] [phone]   [facility] [place] [phone]   [facility] [place]  [place], [place], [place] [phone]   [facility]/Dr. Osei-Bonsu  [address] [place] or [address], [place] [phone] Phone number for both [place] and [place] locations is the same.  [facility]gent [facility] and [facility] [address], [place] [phone]   [facility] [address] [place] or [address] [phone]   [facility] [address], [place] [phone], phone; [phone], fax Sees patients 1st and 3rd Saturday of every month.  Must not qualify for public or private insurance (i.e. Medicaid, Medicare, [place] [employer] Choice, Veterans' Benefits)  Household income should be no more than 200% of the poverty level The clinic cannot treat you if you are pregnant or think you are pregnant  Sexually transmitted diseases are not treated at the clinic.

## 2018-07-14 ENCOUNTER — Ambulatory Visit: Payer: Medicare Other | Admitting: Cardiovascular Disease

## 2018-07-21 DIAGNOSIS — E119 Type 2 diabetes mellitus without complications: Secondary | ICD-10-CM | POA: Diagnosis not present

## 2018-09-15 ENCOUNTER — Other Ambulatory Visit: Payer: Self-pay

## 2018-09-15 ENCOUNTER — Telehealth: Payer: Self-pay | Admitting: *Deleted

## 2018-09-15 NOTE — Telephone Encounter (Deleted)
Error

## 2018-09-15 NOTE — Telephone Encounter (Signed)
   Cardiac Questionnaire:    Since your last visit or hospitalization:    1. Have you been having new or worsening chest pain? Yes, sharp pain under her left breast.   2. Have you been having new or worsening shortness of breath? Yes 3. Have you been having new or worsening leg swelling, wt gain, or increase in abdominal girth (pants fitting more tightly)? No   4. Have you had any passing out spells? Yes, has felt like she was going to faint.    *A YES to any of these questions would result in the appointment being kept. *If all the answers to these questions are NO, we should indicate that given the current situation regarding the worldwide coronarvirus pandemic, at the recommendation of the CDC, we are looking to limit gatherings in our waiting area, and thus will reschedule their appointment beyond four weeks from today.   _____________   CNOBS-96 Pre-Screening Questions:  . Do you currently have a fever? No . Have you recently travelled on a cruise, internationally, or to [place], [place], [place], [place], [place], or [place], [place] [employer]) ? No  . Have you been in contact with someone that is currently pending confirmation of Covid19 testing or has been confirmed to have the Horatio virus?  No . Are you currently experiencing fatigue or cough? No

## 2018-09-15 NOTE — Telephone Encounter (Signed)
Error

## 2018-09-16 ENCOUNTER — Ambulatory Visit: Payer: Medicare Other | Admitting: Cardiology

## 2018-09-16 NOTE — Progress Notes (Deleted)
Cardiology Office Note    Date:  09/16/2018   ID:  Sheri, Soto 09/05/1945, MRN [id number]  PCP:  Kristen Loader, FNP  Cardiologist:  Rima Blizzard [place], MD    History of Present Illness:  Sheri Soto is a 73 y.o. female seen  for evaluation of chest pain. She has a history of prediabetes, dyslipidemia and obesity. She also has a family history of CAD with her father dying at age 56 with MI. She was seen in 2018 with atypical chest pain. She had a normal stress Myoview.  She does have typical GERD symptoms but this is clearly different. She is very sedentary. No prior history of heart disease. She was seen in the ED in January 2020 with chest pain that was felt to be atypical. Troponin levels were normal x 3. CXR was normal. Ecg was normal.     Past Medical History:  Diagnosis Date  . Abnormal Pap smear    5 YEARS AGO  . Anxiety   . Depression   . Diabetes mellitus type 2 in obese ([place])   . Dyslipidemia   . GERD (gastroesophageal reflux disease)   . Migraine   . Personal history of kidney stones     Past Surgical History:  Procedure Laterality Date  . [place]  . GALLBLADDER SURGERY     REMOVED  . TONSILLECTOMY    . TUBAL LIGATION     29 YEARS AGO    Current Medications: Outpatient Medications Prior to Visit  Medication Sig Dispense Refill  . atorvastatin (LIPITOR) 40 MG tablet Take 40 mg by mouth daily.  3  . buPROPion (WELLBUTRIN SR) 150 MG 12 hr tablet TAKE 1 TABLET BY MOUTH EVERY MORNING TWICE A DAY  1  . calcium carbonate (TUMS - DOSED IN MG ELEMENTAL CALCIUM) 500 MG chewable tablet Chew 1 tablet by mouth daily as needed for indigestion or heartburn.    . doxylamine, Sleep, (UNISOM) 25 MG tablet Take 25 mg by mouth at bedtime as needed for sleep.    Marland Kitchen escitalopram (LEXAPRO) 10 MG tablet Take 10 mg by mouth daily.    Marland Kitchen FLUAD 0.5 ML SUSY     . metFORMIN (GLUCOPHAGE-XR) 500 MG 24 hr tablet     . omeprazole (PRILOSEC OTC) 20 MG tablet Take 20  mg by mouth daily as needed (indigestion).    Marland Kitchen omeprazole (PRILOSEC) 20 MG capsule TAKE 2 CAPSULES BY MOUTH ONCE A DAY  1  . zolpidem (AMBIEN) 10 MG tablet TAKE 1 TABLET BY MOUTH EVERY DAY AT BEDTIME AS NEEDED  2   No facility-administered medications prior to visit.      Allergies:   Patient has no known allergies.   Social History   Socioeconomic History  . Marital status: Married    Spouse name: Not on file  . Number of children: 3  . Years of education: Not on file  . Highest education level: Not on file  Occupational History  . Occupation: Retired  Scientific laboratory technician  . Financial resource strain: Not on file  . Food insecurity:    Worry: Not on file    Inability: Not on file  . Transportation needs:    Medical: Not on file    Non-medical: Not on file  Tobacco Use  . Smoking status: Never Smoker  . Smokeless tobacco: Never Used  Substance and Sexual Activity  . Alcohol use: No  . Drug use: No  . Sexual  activity: Yes    Partners: Male    Birth control/protection: Surgical  Lifestyle  . Physical activity:    Days per week: Not on file    Minutes per session: Not on file  . Stress: Not on file  Relationships  . Social connections:    Talks on phone: Not on file    Gets together: Not on file    Attends religious service: Not on file    Active member of club or organization: Not on file    Attends meetings of clubs or organizations: Not on file    Relationship status: Not on file  Other Topics Concern  . Not on file  Social History Narrative   Married. Education: [employer]. Exercise: No.     Family History:  The patient's family history includes Cancer in her father and mother; Clotting disorder in her mother; Heart attack in her father; Heart disease in her father, mother, and paternal grandmother; Hypertension in her father and mother.   ROS:   Please see the history of present illness.    ROS All other systems reviewed and are negative.   PHYSICAL EXAM:    VS:  There were no vitals taken for this visit.   GEN: Well nourished, morbidly obese, in no acute distress  HEENT: normal  Neck: no JVD, carotid bruits, or masses Cardiac: RRR; no murmurs, rubs, or gallops,no edema  Respiratory:  clear to auscultation bilaterally, normal work of breathing GI: soft, nontender, nondistended, + BS MS: no deformity or atrophy  Skin: warm and dry, no rash Neuro:  Alert and Oriented x 3, Strength and sensation are intact Psych: euthymic mood, full affect  Wt Readings from Last 3 Encounters:  06/24/16 180 lb (81.6 kg)  06/17/16 180 lb (81.6 kg)  06/12/16 163 lb (73.9 kg)      Studies/Labs Reviewed:   EKG:  EKG is ordered today.  The ekg ordered today demonstrates NSR with LAD, nonspecific T wave abnormality. I have personally reviewed and interpreted this study.   Recent Labs: 06/24/2018: BUN 8; Creatinine, Ser 0.87; Hemoglobin 14.8; Platelets 211; Potassium 4.0; Sodium 138   Lipid Panel    Component Value Date/Time   CHOL 228 (H) 04/18/2015 1051   TRIG 135 04/18/2015 1051   HDL 43 (L) 04/18/2015 1051   CHOLHDL 5.3 (H) 04/18/2015 1051   VLDL 27 04/18/2015 1051   LDLCALC 158 (H) 04/18/2015 1051    Additional studies/ records that were reviewed today include:  Labs dated 01/28/16: cholesterol 175, triglycerides 189, LDL 97, HDL 41. A1c 6.6% Dated 05/19/16: Glucose 142, CMET and CBC otherwise normal,   Myoview 06/24/16: Study Highlights    The left ventricular ejection fraction is normal (55-65%).  Nuclear stress EF: 55%.  The study is normal.  This is a low risk study.     ASSESSMENT:    No diagnosis found.   PLAN:  In order of problems listed above:  1. Patient has atypical chest pain but has moderate risk for cardiac disease based on her risk factors. Recommend a Lexiscan myoview study to further stratify her risk. If this is a low risk study would focus on risk factor modification and increased aerobic activity. If higher risk  may need to consider angiography.    Medication Adjustments/Labs and Tests Ordered: Current medicines are reviewed at length with the patient today.  Concerns regarding medicines are outlined above.  Medication changes, Labs and Tests ordered today are listed in the Patient Instructions below.  There are no Patient Instructions on file for this visit.   Signed, Maryrose Colvin Martinique, MD  09/16/2018 8:00 AM    [facility] [address], [place], [place], [address] [phone]

## 2018-09-17 ENCOUNTER — Ambulatory Visit (INDEPENDENT_AMBULATORY_CARE_PROVIDER_SITE_OTHER): Payer: Medicare Other | Admitting: Podiatry

## 2018-09-17 ENCOUNTER — Encounter: Payer: Self-pay | Admitting: Podiatry

## 2018-09-17 ENCOUNTER — Other Ambulatory Visit: Payer: Self-pay

## 2018-09-17 VITALS — Temp 97.7°F

## 2018-09-17 DIAGNOSIS — M79676 Pain in unspecified toe(s): Secondary | ICD-10-CM

## 2018-09-17 DIAGNOSIS — M76821 Posterior tibial tendinitis, right leg: Secondary | ICD-10-CM

## 2018-09-17 DIAGNOSIS — B351 Tinea unguium: Secondary | ICD-10-CM

## 2018-09-17 NOTE — Progress Notes (Signed)
Patient ID: Sheri Soto, female   DOB: 02-27-1946, 73 y.o.   MRN: [id number] Complaint:  Visit Type: Patient returns to my office for continued preventative foot care services. Complaint: Patient states" my nails have grown long and thick and become painful to walk and wear shoes" . The patient presents for preventative foot care services. No changes to ROS.  Patient also relates pain in right ankle with swelling with pain when walking.  Podiatric Exam: Vascular: dorsalis pedis and posterior tibial pulses are palpable bilateral. Capillary return is immediate. Temperature gradient is WNL. Skin turgor WNL  Sensorium: Normal Semmes Weinstein monofilament test. Normal tactile sensation bilaterally. Nail Exam: Pt has thick disfigured discolored nails with subungual debris noted  First and second toes right foot. Ulcer Exam: There is no evidence of ulcer or pre-ulcerative changes or infection. Orthopedic Exam: Muscle tone and strength are WNL. No limitations in general ROM. No crepitus or effusions noted. Foot type and digits show no abnormalities. Bony prominences are unremarkable. 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 DJD 1st MPJ  Right foot.  PTTD right with end stage DJD. Skin: No Porokeratosis. No infection or ulcers  Diagnosis:  Onychomycosis, , Pain in right toe, pain in left toes,  PTTD  Treatment & Plan Procedures and Treatment: Consent by patient was obtained for treatment procedures. The patient understood the discussion of treatment and procedures well. All questions were answered thoroughly reviewed. Debridement of mycotic and hypertrophic toenails, 1 through 5 bilateral and clearing of subungual debris. No ulceration, no infection noted.  ABN signed for 2020. Return Visit-Office Procedure: Patient instructed to return to the office for a follow up visit 3 months for continued evaluation and treatment.   Gardiner Barefoot DPM

## 2018-09-20 ENCOUNTER — Telehealth: Payer: Self-pay | Admitting: Cardiology

## 2018-09-20 NOTE — Telephone Encounter (Signed)
New Message   Pt said her appt from 09/16/18 was suppose to be a virtual visit but and no one ever called her   Please call back

## 2018-09-23 NOTE — Telephone Encounter (Signed)
Spoke to patient she stated she is doing good no complaints.Appointment scheduled with Dr.Jordan 02/07/19 at 10:40 am.Advisd to call sooner if needed.

## 2018-10-23 IMAGING — NM NM MISC PROCEDURE
6 series · 36 of 36 positions shown · non-contrast
Comparison: none

[Series 1: wbr_r-proj_st wbr rest · 6.40mm/px · 6 of 64 frames shown]
[frame 6/64]
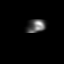
[frame 16/64]
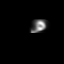
[frame 27/64]
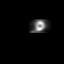
[frame 38/64]
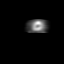
[frame 48/64]
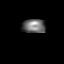
[frame 59/64]
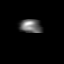

[Series 1: wbr rest · 6.40mm/px · 6 of 64 frames shown]
[frame 6/64]
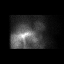
[frame 16/64]
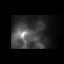
[frame 27/64]
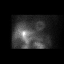
[frame 38/64]
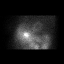
[frame 48/64]
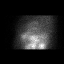
[frame 59/64]
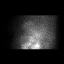

[Series 2: wbr stress-gsp · 6.40mm/px · 6 of 512 frames shown]
[frame 43/512]
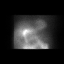
[frame 128/512]
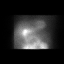
[frame 214/512]
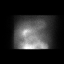
[frame 299/512]
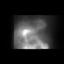
[frame 384/512]
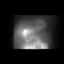
[frame 470/512]
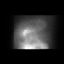

[Series 2: wbr_s-proj_st wbr stress-gsp · 6.40mm/px · 6 of 512 frames shown]
[frame 43/512]
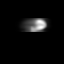
[frame 128/512]
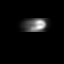
[frame 214/512]
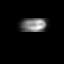
[frame 299/512]
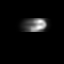
[frame 384/512]
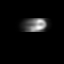
[frame 470/512]
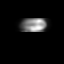

[Series 3: wbr_s-proj_st wbr stress-sum-em · 6.40mm/px · 6 of 64 frames shown]
[frame 6/64]
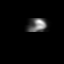
[frame 16/64]
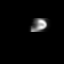
[frame 27/64]
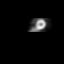
[frame 38/64]
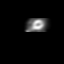
[frame 48/64]
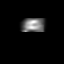
[frame 59/64]
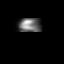

[Series 3: wbr stress-sum-em · 6.40mm/px · 6 of 64 frames shown]
[frame 6/64]
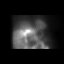
[frame 16/64]
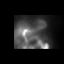
[frame 27/64]
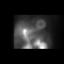
[frame 38/64]
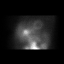
[frame 48/64]
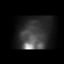
[frame 59/64]
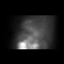

[36 of 36 positions shown; findings below may reference images not displayed]

Canned report from images found in remote index.

Refer to host system for actual result text.

## 2018-12-02 DIAGNOSIS — F331 Major depressive disorder, recurrent, moderate: Secondary | ICD-10-CM | POA: Diagnosis not present

## 2018-12-02 DIAGNOSIS — E1169 Type 2 diabetes mellitus with other specified complication: Secondary | ICD-10-CM | POA: Diagnosis not present

## 2018-12-02 DIAGNOSIS — E785 Hyperlipidemia, unspecified: Secondary | ICD-10-CM | POA: Diagnosis not present

## 2018-12-02 DIAGNOSIS — F5101 Primary insomnia: Secondary | ICD-10-CM | POA: Diagnosis not present

## 2018-12-02 DIAGNOSIS — Z7984 Long term (current) use of oral hypoglycemic drugs: Secondary | ICD-10-CM | POA: Diagnosis not present

## 2018-12-21 ENCOUNTER — Other Ambulatory Visit: Payer: Self-pay

## 2018-12-21 ENCOUNTER — Encounter: Payer: Self-pay | Admitting: Podiatry

## 2018-12-21 ENCOUNTER — Ambulatory Visit (INDEPENDENT_AMBULATORY_CARE_PROVIDER_SITE_OTHER): Payer: Medicare Other | Admitting: Podiatry

## 2018-12-21 DIAGNOSIS — B351 Tinea unguium: Secondary | ICD-10-CM

## 2018-12-21 DIAGNOSIS — M79674 Pain in right toe(s): Secondary | ICD-10-CM | POA: Diagnosis not present

## 2018-12-21 DIAGNOSIS — M79675 Pain in left toe(s): Secondary | ICD-10-CM

## 2018-12-21 DIAGNOSIS — E119 Type 2 diabetes mellitus without complications: Secondary | ICD-10-CM | POA: Insufficient documentation

## 2018-12-21 DIAGNOSIS — M76829 Posterior tibial tendinitis, unspecified leg: Secondary | ICD-10-CM

## 2018-12-21 NOTE — Progress Notes (Signed)
Patient ID: Sheri Soto, female   DOB: [birth year], 73 y.o.   MRN: [id number] Complaint:  Visit Type: Patient returns to my office for continued preventative foot care services. Complaint: Patient states" my nails have grown long and thick and become painful to walk and wear shoes" . The patient presents for preventative foot care services. No changes to ROS.  Patient also relates pain in right ankle with swelling with pain when walking.  Podiatric Exam: Vascular: dorsalis pedis and posterior tibial pulses are palpable bilateral. Capillary return is immediate. Temperature gradient is WNL. Skin turgor WNL  Sensorium: Normal Semmes Weinstein monofilament test. Normal tactile sensation bilaterally. Nail Exam: Pt has thick disfigured discolored nails with subungual debris noted  First and second toes right foot. Nail loosesly attached right hallux. Ulcer Exam: There is no evidence of ulcer or pre-ulcerative changes or infection. Orthopedic Exam: Muscle tone and strength are WNL. No limitations in general ROM. No crepitus or effusions noted. Foot type and digits show no abnormalities. Bony prominences are unremarkable. 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 DJD 1st MPJ  Right foot.  PTTD right with end stage DJD. Skin: No Porokeratosis. No infection or ulcers  Diagnosis:  Onychomycosis, , Pain in right toe, pain in left toes,  PTTD  Treatment & Plan Procedures and Treatment: Consent by patient was obtained for treatment procedures. The patient understood the discussion of treatment and procedures well. All questions were answered thoroughly reviewed. Debridement of mycotic and hypertrophic toenails, 1 through 5 bilateral and clearing of subungual debris. No ulceration, no infection noted.  Nail is loosely attached to nail bed right hallux. Return Visit-Office Procedure: Patient instructed to return to the office for a follow up visit 3 months for continued evaluation and  treatment.   Gardiner Barefoot DPM

## 2019-02-07 ENCOUNTER — Ambulatory Visit: Payer: Medicare Other | Admitting: Cardiology

## 2019-03-30 ENCOUNTER — Encounter: Payer: Self-pay | Admitting: Podiatry

## 2019-03-30 ENCOUNTER — Other Ambulatory Visit: Payer: Self-pay

## 2019-03-30 ENCOUNTER — Ambulatory Visit (INDEPENDENT_AMBULATORY_CARE_PROVIDER_SITE_OTHER): Payer: Medicare Other | Admitting: Podiatry

## 2019-03-30 DIAGNOSIS — E119 Type 2 diabetes mellitus without complications: Secondary | ICD-10-CM

## 2019-03-30 DIAGNOSIS — M79675 Pain in left toe(s): Secondary | ICD-10-CM | POA: Diagnosis not present

## 2019-03-30 DIAGNOSIS — B351 Tinea unguium: Secondary | ICD-10-CM

## 2019-03-30 DIAGNOSIS — M79674 Pain in right toe(s): Secondary | ICD-10-CM

## 2019-03-30 NOTE — Progress Notes (Signed)
Patient ID: Sheri Soto, female   DOB: 1945-07-02, 73 y.o.   MRN: [id number] Complaint:  Visit Type: Patient returns to my office for continued preventative foot care services. Complaint: Patient states" my nails have grown long and thick and become painful to walk and wear shoes" . The patient presents for preventative foot care services. No changes to ROS.    Podiatric Exam: Vascular: dorsalis pedis and posterior tibial pulses are palpable bilateral. Capillary return is immediate. Temperature gradient is WNL. Skin turgor WNL  Sensorium: Normal Semmes Weinstein monofilament test. Normal tactile sensation bilaterally. Nail Exam: Pt has thick disfigured discolored nails with subungual debris noted  First and second toes right foot. Nail loosesly attached right hallux. Ulcer Exam: There is no evidence of ulcer or pre-ulcerative changes or infection. Orthopedic Exam: Muscle tone and strength are WNL. No limitations in general ROM. No crepitus or effusions noted. Foot type and digits show no abnormalities. Bony prominences are unremarkable. 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 DJD 1st MPJ  Right foot.  PTTD right with end stage DJD. Skin: No Porokeratosis. No infection or ulcers  Diagnosis:  Onychomycosis, , Pain in right toe, pain in left toes,  PTTD  Treatment & Plan Procedures and Treatment: Consent by patient was obtained for treatment procedures. The patient understood the discussion of treatment and procedures well. All questions were answered thoroughly reviewed. Debridement of mycotic and hypertrophic toenails, 1 through 5 bilateral and clearing of subungual debris. No ulceration, no infection noted.   Return Visit-Office Procedure: Patient instructed to return to the office for a follow up visit 3 months for continued evaluation and treatment.   Gardiner Barefoot DPM

## 2019-06-29 ENCOUNTER — Other Ambulatory Visit: Payer: Self-pay

## 2019-06-29 ENCOUNTER — Ambulatory Visit (INDEPENDENT_AMBULATORY_CARE_PROVIDER_SITE_OTHER): Payer: Medicare Other | Admitting: Podiatry

## 2019-06-29 ENCOUNTER — Encounter: Payer: Self-pay | Admitting: Podiatry

## 2019-06-29 ENCOUNTER — Other Ambulatory Visit: Payer: Self-pay | Admitting: Family Medicine

## 2019-06-29 DIAGNOSIS — M79674 Pain in right toe(s): Secondary | ICD-10-CM | POA: Diagnosis not present

## 2019-06-29 DIAGNOSIS — E2839 Other primary ovarian failure: Secondary | ICD-10-CM

## 2019-06-29 DIAGNOSIS — E119 Type 2 diabetes mellitus without complications: Secondary | ICD-10-CM | POA: Diagnosis not present

## 2019-06-29 DIAGNOSIS — M76829 Posterior tibial tendinitis, unspecified leg: Secondary | ICD-10-CM

## 2019-06-29 DIAGNOSIS — B351 Tinea unguium: Secondary | ICD-10-CM | POA: Diagnosis not present

## 2019-06-29 DIAGNOSIS — M79675 Pain in left toe(s): Secondary | ICD-10-CM | POA: Diagnosis not present

## 2019-06-29 DIAGNOSIS — Z1231 Encounter for screening mammogram for malignant neoplasm of breast: Secondary | ICD-10-CM

## 2019-06-29 NOTE — Progress Notes (Signed)
Patient ID: Sheri Soto, female   DOB: 02-17-1946, 74 y.o.   MRN: [id number] Complaint:  Visit Type: Patient returns to my office for continued preventative foot care services. Complaint: Patient states" my nails have grown long and thick and become painful to walk and wear shoes" . The patient presents for preventative foot care services. No changes to ROS.    Podiatric Exam: Vascular: dorsalis pedis and posterior tibial pulses are palpable bilateral. Capillary return is immediate. Temperature gradient is WNL. Skin turgor WNL  Sensorium: Normal Semmes Weinstein monofilament test. Normal tactile sensation bilaterally. Nail Exam: Pt has thick disfigured discolored nails with subungual debris noted 1,2 right. Ulcer Exam: There is no evidence of ulcer or pre-ulcerative changes or infection. Orthopedic Exam: Muscle tone and strength are WNL. No limitations in general ROM. No crepitus or effusions noted. Foot type and digits show no abnormalities. Bony prominences are unremarkable. 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 DJD 1st MPJ  Right foot.  PTTD right with end stage DJD. Skin: No Porokeratosis. No infection or ulcers  Diagnosis:  Onychomycosis, , Pain in right toe, ,  PTTD  Treatment & Plan Procedures and Treatment: Consent by patient was obtained for treatment procedures. The patient understood the discussion of treatment and procedures well. All questions were answered thoroughly reviewed. Debridement of mycotic and hypertrophic toenails, 1 through 5 right  and clearing of subungual debris. No ulceration, no infection noted.   Return Visit-Office Procedure: Patient instructed to return to the office for a follow up visit 3 months for continued evaluation and treatment.   Gardiner Barefoot DPM

## 2019-09-15 ENCOUNTER — Ambulatory Visit
Admission: RE | Admit: 2019-09-15 | Discharge: 2019-09-15 | Disposition: A | Discharge status: Home / Self Care | Payer: Medicare Other | Source: Ambulatory Visit | Attending: Family Medicine | Admitting: Family Medicine

## 2019-09-15 ENCOUNTER — Other Ambulatory Visit: Payer: Self-pay

## 2019-09-15 DIAGNOSIS — E2839 Other primary ovarian failure: Secondary | ICD-10-CM

## 2019-09-15 DIAGNOSIS — Z1231 Encounter for screening mammogram for malignant neoplasm of breast: Secondary | ICD-10-CM

## 2019-09-28 ENCOUNTER — Encounter: Payer: Self-pay | Admitting: Podiatry

## 2019-09-28 ENCOUNTER — Ambulatory Visit (INDEPENDENT_AMBULATORY_CARE_PROVIDER_SITE_OTHER): Payer: Medicare Other | Admitting: Podiatry

## 2019-09-28 ENCOUNTER — Other Ambulatory Visit: Payer: Self-pay

## 2019-09-28 VITALS — Temp 97.7°F

## 2019-09-28 DIAGNOSIS — E119 Type 2 diabetes mellitus without complications: Secondary | ICD-10-CM

## 2019-09-28 DIAGNOSIS — M79675 Pain in left toe(s): Secondary | ICD-10-CM | POA: Diagnosis not present

## 2019-09-28 DIAGNOSIS — M79674 Pain in right toe(s): Secondary | ICD-10-CM

## 2019-09-28 DIAGNOSIS — B351 Tinea unguium: Secondary | ICD-10-CM | POA: Diagnosis not present

## 2019-09-28 DIAGNOSIS — M76829 Posterior tibial tendinitis, unspecified leg: Secondary | ICD-10-CM | POA: Diagnosis not present

## 2019-09-28 NOTE — Progress Notes (Signed)
This patient returns to my office for at risk foot care.  This patient requires this care by a professional since this patient will be at risk due to having diabetes mellitus.  This patient is unable to cut nails herself since the patient cannot reach her nails.These nails are painful walking and wearing shoes.  This patient presents for at risk foot care today.  General Appearance  Alert, conversant and in no acute stress.  Vascular  Dorsalis pedis and posterior tibial  pulses are palpable  bilaterally.  Capillary return is within normal limits  bilaterally. Temperature is within normal limits  bilaterally.  Neurologic  Senn-Weinstein monofilament wire test within normal limits  bilaterally. Muscle power within normal limits bilaterally.  Nails Thick disfigured discolored nails with subungual debris  from hallux to fifth toes bilaterally. No evidence of bacterial infection or drainage bilaterally.  Orthopedic  No limitations of motion  feet .  No crepitus or effusions noted.  No bony pathology or digital deformities noted. PTTD riht foot.  Skin  normotropic skin with no porokeratosis noted bilaterally.  No signs of infections or ulcers noted.     Onychomycosis  Pain in right toes  Pain in left toes  Consent was obtained for treatment procedures.   Mechanical debridement of nails 1-5  bilaterally performed with a nail nipper.  Filed with dremel without incident.    Return office visit     3 months                Told patient to return for periodic foot care and evaluation due to potential at risk complications.   Josselyn Harkins DPM  

## 2019-12-28 ENCOUNTER — Other Ambulatory Visit: Payer: Self-pay

## 2019-12-28 ENCOUNTER — Encounter: Payer: Self-pay | Admitting: Podiatry

## 2019-12-28 ENCOUNTER — Ambulatory Visit (INDEPENDENT_AMBULATORY_CARE_PROVIDER_SITE_OTHER): Payer: Medicare Other | Admitting: Podiatry

## 2019-12-28 DIAGNOSIS — E119 Type 2 diabetes mellitus without complications: Secondary | ICD-10-CM

## 2019-12-28 DIAGNOSIS — M79675 Pain in left toe(s): Secondary | ICD-10-CM

## 2019-12-28 DIAGNOSIS — M76829 Posterior tibial tendinitis, unspecified leg: Secondary | ICD-10-CM | POA: Diagnosis not present

## 2019-12-28 DIAGNOSIS — B351 Tinea unguium: Secondary | ICD-10-CM | POA: Diagnosis not present

## 2019-12-28 DIAGNOSIS — M79674 Pain in right toe(s): Secondary | ICD-10-CM

## 2019-12-28 NOTE — Progress Notes (Signed)
This patient returns to my office for at risk foot care.  This patient requires this care by a professional since this patient will be at risk due to having diabetes mellitus.  This patient is unable to cut nails herself since the patient cannot reach her nails.These nails are painful walking and wearing shoes.  This patient presents for at risk foot care today.  General Appearance  Alert, conversant and in no acute stress.  Vascular  Dorsalis pedis and posterior tibial  pulses are palpable  bilaterally.  Capillary return is within normal limits  bilaterally. Temperature is within normal limits  bilaterally.  Neurologic  Senn-Weinstein monofilament wire test within normal limits  bilaterally. Muscle power within normal limits bilaterally.  Nails Thick disfigured discolored nails with subungual debris  from hallux to fifth toes bilaterally. No evidence of bacterial infection or drainage bilaterally.  Orthopedic  No limitations of motion  feet .  No crepitus or effusions noted.  No bony pathology or digital deformities noted. PTTD riht foot.  Skin  normotropic skin with no porokeratosis noted bilaterally.  No signs of infections or ulcers noted.     Onychomycosis  Pain in right toes  Pain in left toes  Consent was obtained for treatment procedures.   Mechanical debridement of nails 1-5  bilaterally performed with a nail nipper.  Filed with dremel without incident.    Return office visit     3 months                Told patient to return for periodic foot care and evaluation due to potential at risk complications.   Gardiner Barefoot DPM

## 2020-04-06 ENCOUNTER — Ambulatory Visit (INDEPENDENT_AMBULATORY_CARE_PROVIDER_SITE_OTHER): Payer: Medicare Other | Admitting: Podiatry

## 2020-04-06 ENCOUNTER — Encounter: Payer: Self-pay | Admitting: Podiatry

## 2020-04-06 DIAGNOSIS — M76829 Posterior tibial tendinitis, unspecified leg: Secondary | ICD-10-CM

## 2020-04-06 DIAGNOSIS — E119 Type 2 diabetes mellitus without complications: Secondary | ICD-10-CM

## 2020-04-06 DIAGNOSIS — B351 Tinea unguium: Secondary | ICD-10-CM

## 2020-04-06 DIAGNOSIS — M79674 Pain in right toe(s): Secondary | ICD-10-CM

## 2020-04-06 DIAGNOSIS — M79675 Pain in left toe(s): Secondary | ICD-10-CM | POA: Diagnosis not present

## 2020-04-06 NOTE — Progress Notes (Signed)
This patient returns to my office for at risk foot care.  This patient requires this care by a professional since this patient will be at risk due to having diabetes mellitus.  This patient is unable to cut nails herself since the patient cannot reach her nails.These nails are painful walking and wearing shoes.  This patient presents for at risk foot care today.  General Appearance  Alert, conversant and in no acute stress.  Vascular  Dorsalis pedis and posterior tibial  pulses are palpable  bilaterally.  Capillary return is within normal limits  bilaterally. Temperature is within normal limits  bilaterally.  Neurologic  Senn-Weinstein monofilament wire test within normal limits  bilaterally. Muscle power within normal limits bilaterally.  Nails Thick disfigured discolored nails with subungual debris  from hallux to fifth toes bilaterally. No evidence of bacterial infection or drainage bilaterally.  Orthopedic  No limitations of motion  feet .  No crepitus or effusions noted.  No bony pathology or digital deformities noted. PTTD riht foot.  Skin  normotropic skin with no porokeratosis noted bilaterally.  No signs of infections or ulcers noted.     Onychomycosis  Pain in right toes  Pain in left toes  Consent was obtained for treatment procedures.   Mechanical debridement of nails 1-5  bilaterally performed with a nail nipper.  Filed with dremel without incident.    Return office visit     3 months                Told patient to return for periodic foot care and evaluation due to potential at risk complications.   Gardiner Barefoot DPM

## 2020-04-10 DIAGNOSIS — Z7984 Long term (current) use of oral hypoglycemic drugs: Secondary | ICD-10-CM | POA: Diagnosis not present

## 2020-04-10 DIAGNOSIS — R809 Proteinuria, unspecified: Secondary | ICD-10-CM | POA: Diagnosis not present

## 2020-04-10 DIAGNOSIS — E1169 Type 2 diabetes mellitus with other specified complication: Secondary | ICD-10-CM | POA: Diagnosis not present

## 2020-04-10 DIAGNOSIS — F3341 Major depressive disorder, recurrent, in partial remission: Secondary | ICD-10-CM | POA: Diagnosis not present

## 2020-04-10 DIAGNOSIS — E785 Hyperlipidemia, unspecified: Secondary | ICD-10-CM | POA: Diagnosis not present

## 2020-04-10 DIAGNOSIS — F5101 Primary insomnia: Secondary | ICD-10-CM | POA: Diagnosis not present

## 2020-07-13 ENCOUNTER — Ambulatory Visit: Payer: Medicare Other | Admitting: Podiatry

## 2020-08-01 ENCOUNTER — Ambulatory Visit (INDEPENDENT_AMBULATORY_CARE_PROVIDER_SITE_OTHER): Payer: Medicare Other | Admitting: Podiatry

## 2020-08-01 ENCOUNTER — Other Ambulatory Visit: Payer: Self-pay

## 2020-08-01 DIAGNOSIS — M79674 Pain in right toe(s): Secondary | ICD-10-CM | POA: Diagnosis not present

## 2020-08-01 DIAGNOSIS — M79675 Pain in left toe(s): Secondary | ICD-10-CM | POA: Diagnosis not present

## 2020-08-01 DIAGNOSIS — M76829 Posterior tibial tendinitis, unspecified leg: Secondary | ICD-10-CM

## 2020-08-01 DIAGNOSIS — B351 Tinea unguium: Secondary | ICD-10-CM

## 2020-08-01 DIAGNOSIS — E119 Type 2 diabetes mellitus without complications: Secondary | ICD-10-CM | POA: Diagnosis not present

## 2020-08-01 NOTE — Progress Notes (Signed)
This patient returns to my office for at risk foot care.  This patient requires this care by a professional since this patient will be at risk due to having diabetes mellitus.  This patient is unable to cut nails herself since the patient cannot reach her nails.These nails are painful walking and wearing shoes.  This patient presents for at risk foot care today.  General Appearance  Alert, conversant and in no acute stress.  Vascular  Dorsalis pedis and posterior tibial  pulses are palpable  bilaterally.  Capillary return is within normal limits  bilaterally. Temperature is within normal limits  bilaterally.  Neurologic  Senn-Weinstein monofilament wire test within normal limits  bilaterally. Muscle power within normal limits bilaterally.  Nails Thick disfigured discolored nails with subungual debris  from hallux to fifth toes bilaterally. No evidence of bacterial infection or drainage bilaterally.  Orthopedic  No limitations of motion  feet .  No crepitus or effusions noted.  No bony pathology or digital deformities noted. PTTD riht foot.  Skin  normotropic skin with no porokeratosis noted bilaterally.  No signs of infections or ulcers noted.     Onychomycosis  Pain in right toes  Pain in left toes  Consent was obtained for treatment procedures.   Mechanical debridement of nails 1-5  bilaterally performed with a nail nipper.  Filed with dremel without incident.    Return office visit     3 months                Told patient to return for periodic foot care and evaluation due to potential at risk complications.   Gardiner Barefoot DPM

## 2020-10-19 DIAGNOSIS — N1831 Chronic kidney disease, stage 3a: Secondary | ICD-10-CM | POA: Diagnosis not present

## 2020-10-19 DIAGNOSIS — E1169 Type 2 diabetes mellitus with other specified complication: Secondary | ICD-10-CM | POA: Diagnosis not present

## 2020-10-19 DIAGNOSIS — R809 Proteinuria, unspecified: Secondary | ICD-10-CM | POA: Diagnosis not present

## 2020-10-19 DIAGNOSIS — Z7984 Long term (current) use of oral hypoglycemic drugs: Secondary | ICD-10-CM | POA: Diagnosis not present

## 2020-10-19 DIAGNOSIS — M8589 Other specified disorders of bone density and structure, multiple sites: Secondary | ICD-10-CM | POA: Diagnosis not present

## 2020-10-19 DIAGNOSIS — E785 Hyperlipidemia, unspecified: Secondary | ICD-10-CM | POA: Diagnosis not present

## 2020-10-19 DIAGNOSIS — Z0000 Encounter for general adult medical examination without abnormal findings: Secondary | ICD-10-CM | POA: Diagnosis not present

## 2020-10-19 DIAGNOSIS — F5101 Primary insomnia: Secondary | ICD-10-CM | POA: Diagnosis not present

## 2020-10-19 DIAGNOSIS — F3341 Major depressive disorder, recurrent, in partial remission: Secondary | ICD-10-CM | POA: Diagnosis not present

## 2020-10-19 DIAGNOSIS — K219 Gastro-esophageal reflux disease without esophagitis: Secondary | ICD-10-CM | POA: Diagnosis not present

## 2020-10-22 IMAGING — DX DG CHEST 2V
2 series · 2 of 2 positions shown · non-contrast
Comparison: 07/11/2012

CLINICAL DATA: Left-sided chest pain for several months

EXAM:
CHEST - 2 VIEW

[chest pa]
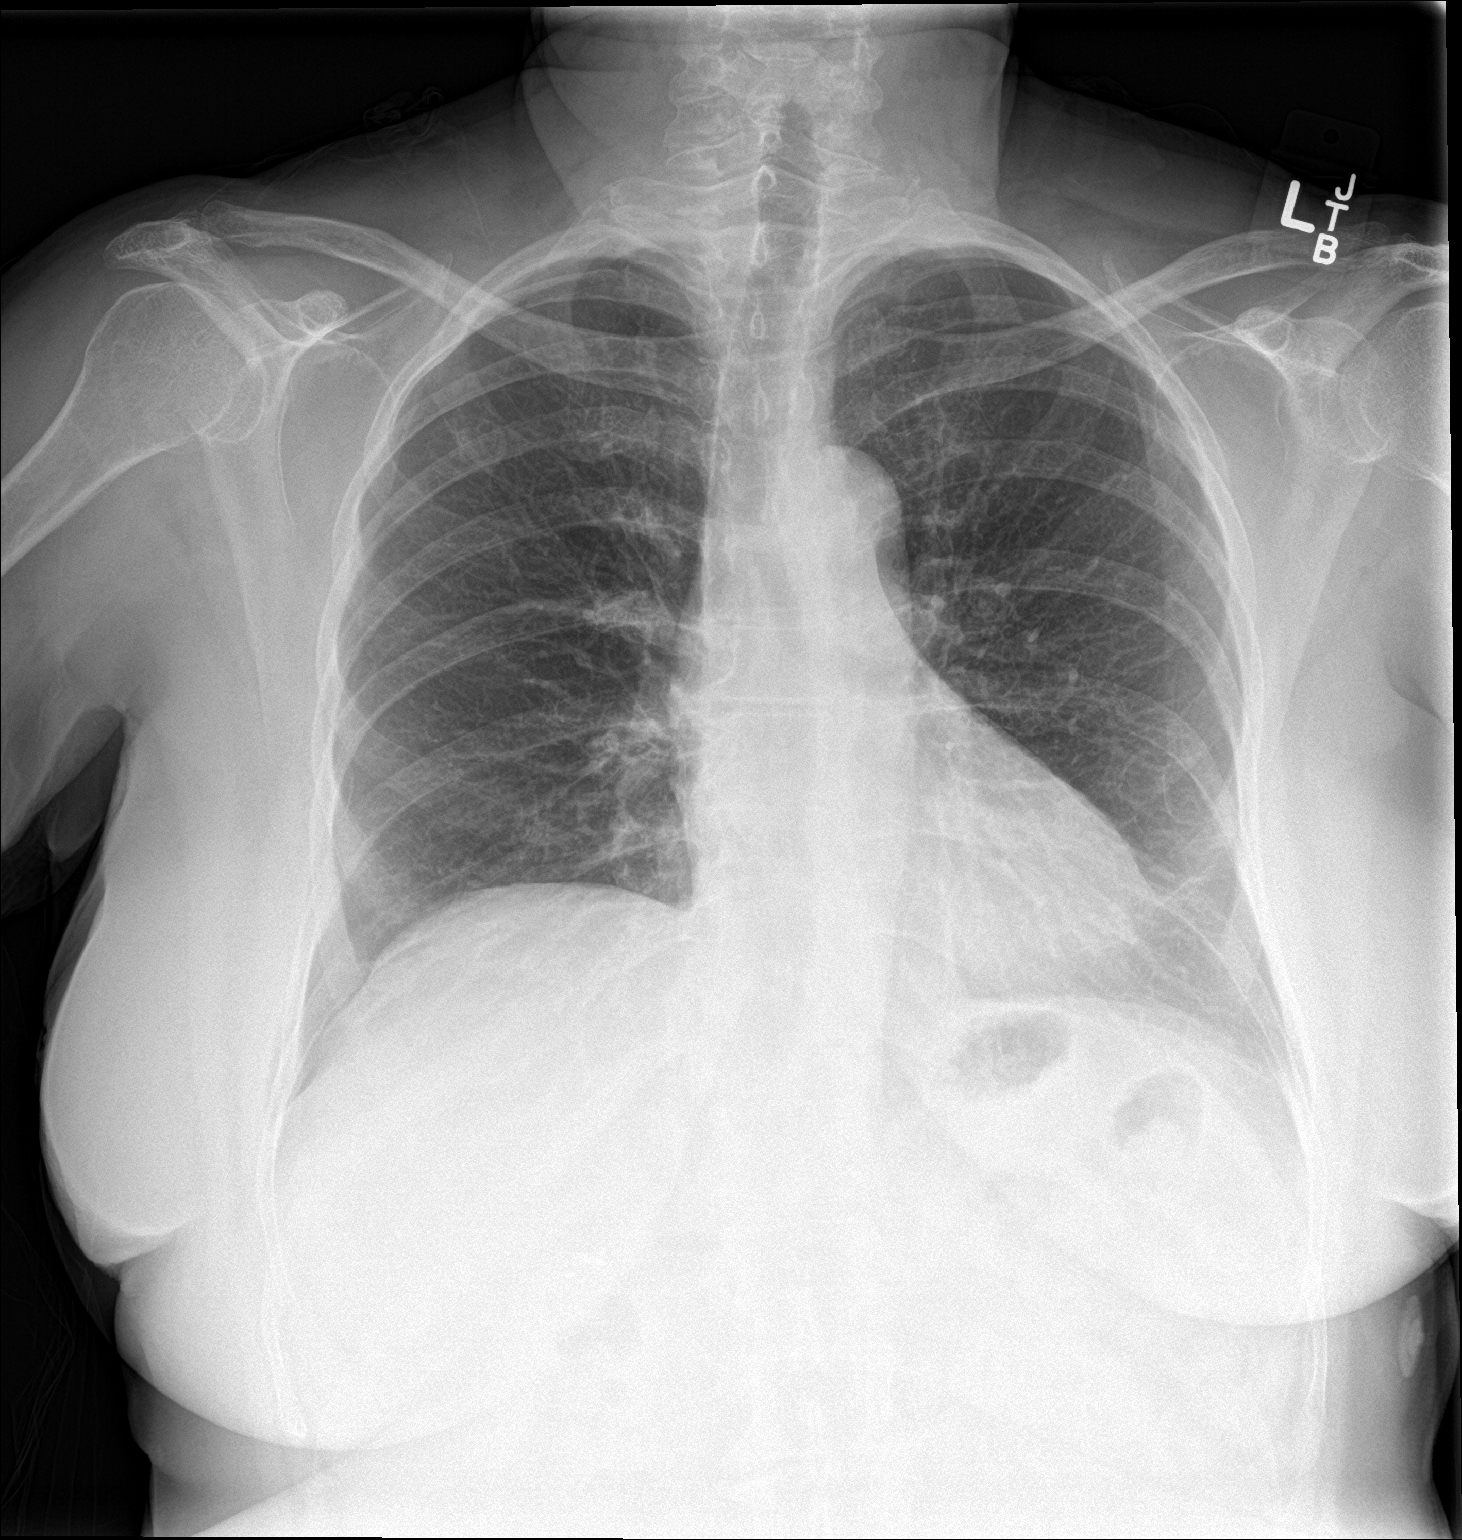

[chest lat]
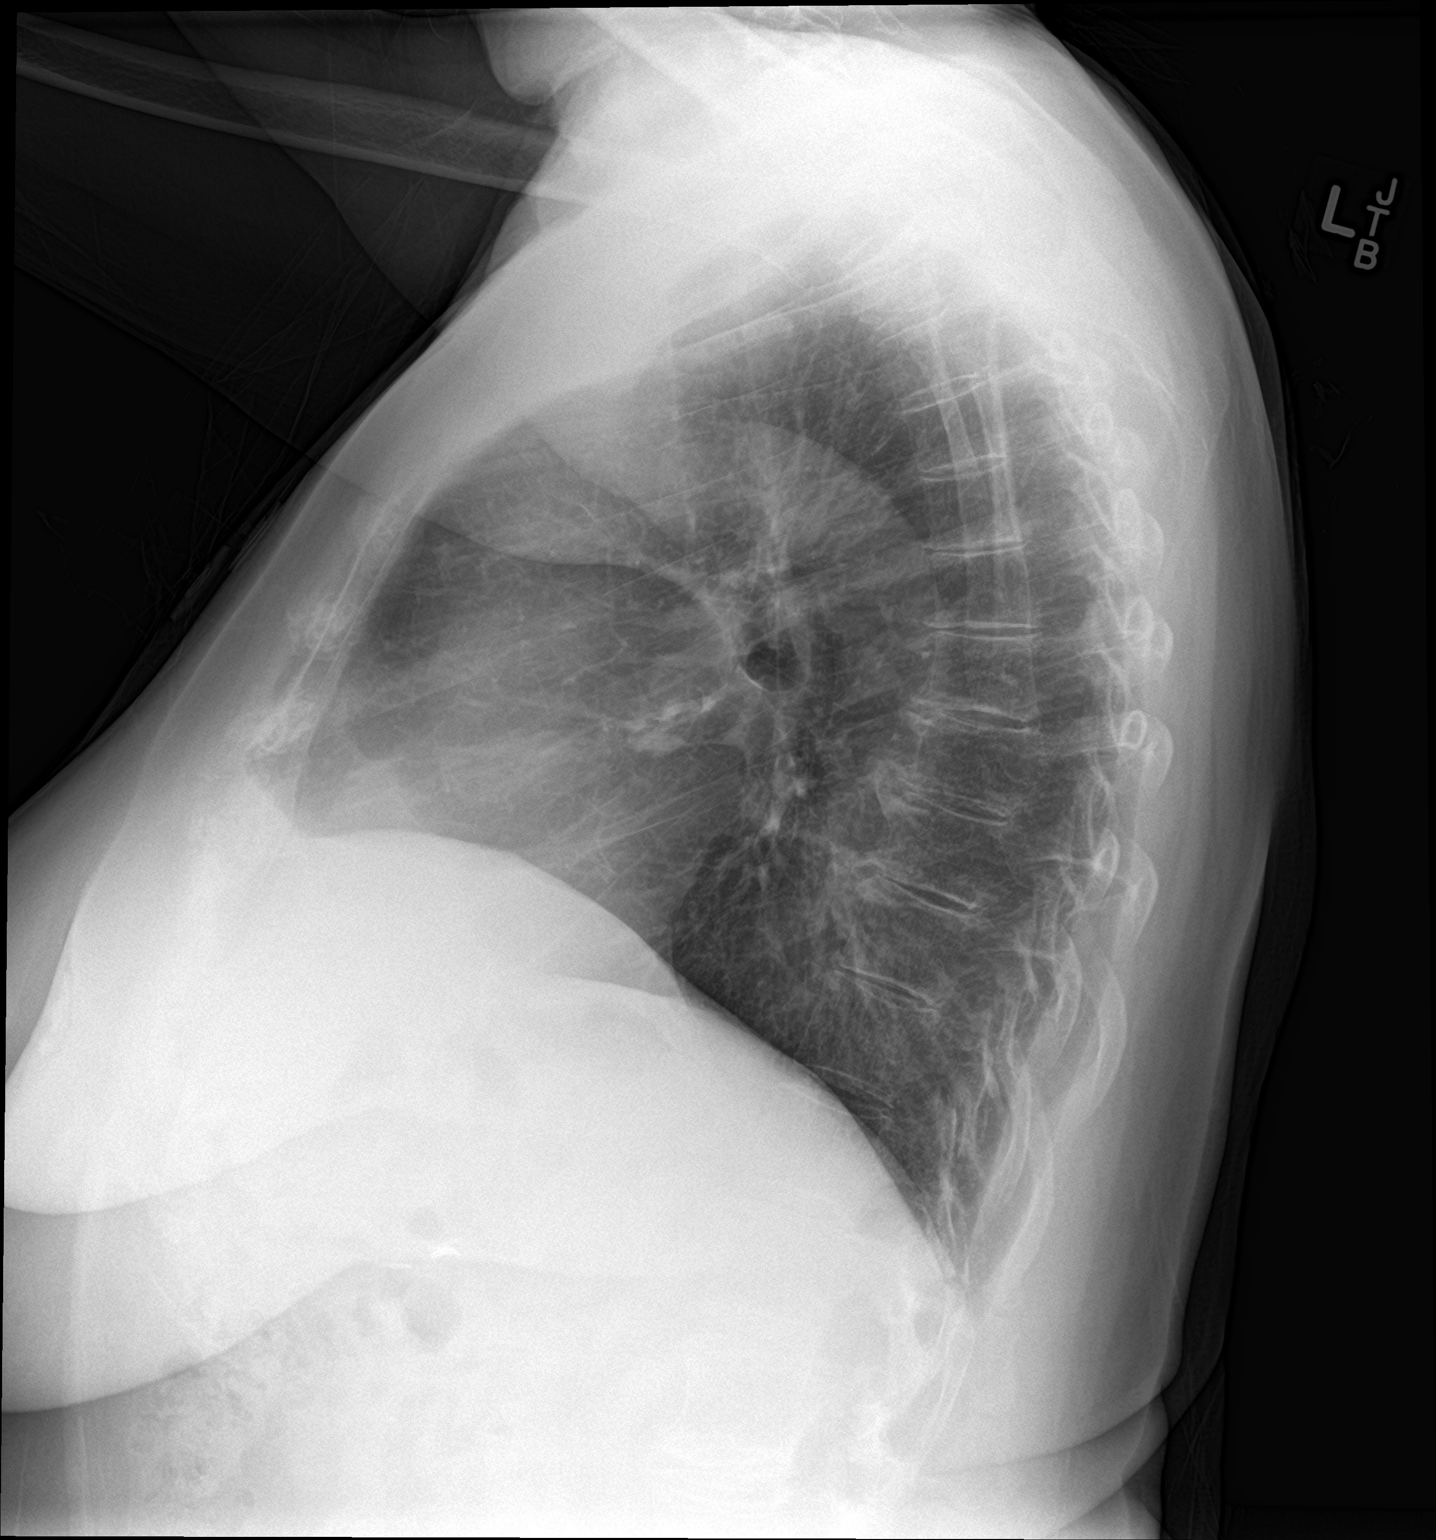

[2 of 2 positions shown; findings below may reference images not displayed]

FINDINGS: The heart size and mediastinal contours are within normal limits.
Both lungs are clear. The visualized skeletal structures are
unremarkable.
IMPRESSION: No active cardiopulmonary disease.

## 2020-10-31 ENCOUNTER — Other Ambulatory Visit: Payer: Self-pay

## 2020-10-31 ENCOUNTER — Ambulatory Visit (INDEPENDENT_AMBULATORY_CARE_PROVIDER_SITE_OTHER): Payer: Medicare Other | Admitting: Podiatry

## 2020-10-31 ENCOUNTER — Encounter: Payer: Self-pay | Admitting: Podiatry

## 2020-10-31 DIAGNOSIS — B351 Tinea unguium: Secondary | ICD-10-CM | POA: Diagnosis not present

## 2020-10-31 DIAGNOSIS — K219 Gastro-esophageal reflux disease without esophagitis: Secondary | ICD-10-CM | POA: Insufficient documentation

## 2020-10-31 DIAGNOSIS — M76829 Posterior tibial tendinitis, unspecified leg: Secondary | ICD-10-CM

## 2020-10-31 DIAGNOSIS — R809 Proteinuria, unspecified: Secondary | ICD-10-CM | POA: Insufficient documentation

## 2020-10-31 DIAGNOSIS — E2839 Other primary ovarian failure: Secondary | ICD-10-CM | POA: Insufficient documentation

## 2020-10-31 DIAGNOSIS — M79675 Pain in left toe(s): Secondary | ICD-10-CM

## 2020-10-31 DIAGNOSIS — D489 Neoplasm of uncertain behavior, unspecified: Secondary | ICD-10-CM | POA: Insufficient documentation

## 2020-10-31 DIAGNOSIS — E119 Type 2 diabetes mellitus without complications: Secondary | ICD-10-CM

## 2020-10-31 DIAGNOSIS — F3340 Major depressive disorder, recurrent, in remission, unspecified: Secondary | ICD-10-CM | POA: Insufficient documentation

## 2020-10-31 DIAGNOSIS — M79674 Pain in right toe(s): Secondary | ICD-10-CM

## 2020-10-31 DIAGNOSIS — M8580 Other specified disorders of bone density and structure, unspecified site: Secondary | ICD-10-CM | POA: Insufficient documentation

## 2020-10-31 NOTE — Progress Notes (Signed)
This patient returns to my office for at risk foot care.  This patient requires this care by a professional since this patient will be at risk due to having diabetes mellitus.  This patient is unable to cut nails herself since the patient cannot reach her nails.These nails are painful walking and wearing shoes.  This patient presents for at risk foot care today.  General Appearance  Alert, conversant and in no acute stress.  Vascular  Dorsalis pedis and posterior tibial  pulses are palpable  bilaterally.  Capillary return is within normal limits  bilaterally. Temperature is within normal limits  bilaterally.  Neurologic  Senn-Weinstein monofilament wire test within normal limits  bilaterally. Muscle power within normal limits bilaterally.  Nails Thick disfigured discolored nails with subungual debris  from hallux to fifth toes bilaterally. No evidence of bacterial infection or drainage bilaterally.  Orthopedic  No limitations of motion  feet .  No crepitus or effusions noted.  No bony pathology or digital deformities noted. PTTD riht foot.  Skin  normotropic skin with no porokeratosis noted bilaterally.  No signs of infections or ulcers noted.     Onychomycosis  Pain in right toes  Pain in left toes  Consent was obtained for treatment procedures.   Mechanical debridement of nails 1-5  bilaterally performed with a nail nipper.  Filed with dremel without incident.    Return office visit     3 months                Told patient to return for periodic foot care and evaluation due to potential at risk complications.   Gardiner Barefoot DPM

## 2020-12-13 ENCOUNTER — Encounter: Payer: Self-pay | Admitting: Podiatry

## 2020-12-13 ENCOUNTER — Telehealth: Payer: Self-pay | Admitting: Podiatry

## 2020-12-13 NOTE — Telephone Encounter (Signed)
Called patient lvm to reschedule 8/22 appt and sent letter

## 2021-02-04 ENCOUNTER — Ambulatory Visit: Payer: Medicare Other | Admitting: Podiatry

## 2021-02-12 ENCOUNTER — Ambulatory Visit (INDEPENDENT_AMBULATORY_CARE_PROVIDER_SITE_OTHER): Payer: Medicare Other | Admitting: Podiatry

## 2021-02-12 ENCOUNTER — Other Ambulatory Visit: Payer: Self-pay

## 2021-02-12 ENCOUNTER — Encounter: Payer: Self-pay | Admitting: Podiatry

## 2021-02-12 DIAGNOSIS — M79674 Pain in right toe(s): Secondary | ICD-10-CM

## 2021-02-12 DIAGNOSIS — M76829 Posterior tibial tendinitis, unspecified leg: Secondary | ICD-10-CM

## 2021-02-12 DIAGNOSIS — E119 Type 2 diabetes mellitus without complications: Secondary | ICD-10-CM

## 2021-02-12 DIAGNOSIS — M79675 Pain in left toe(s): Secondary | ICD-10-CM

## 2021-02-12 DIAGNOSIS — B351 Tinea unguium: Secondary | ICD-10-CM | POA: Diagnosis not present

## 2021-02-12 NOTE — Progress Notes (Signed)
This patient returns to my office for at risk foot care.  This patient requires this care by a professional since this patient will be at risk due to having diabetes mellitus.  This patient is unable to cut nails herself since the patient cannot reach her nails.These nails are painful walking and wearing shoes.  This patient presents for at risk foot care today.  General Appearance  Alert, conversant and in no acute stress.  Vascular  Dorsalis pedis and posterior tibial  pulses are palpable  bilaterally.  Capillary return is within normal limits  bilaterally. Temperature is within normal limits  bilaterally.  Neurologic  Senn-Weinstein monofilament wire test within normal limits  bilaterally. Muscle power within normal limits bilaterally.  Nails Thick disfigured discolored nails with subungual debris  from hallux to fifth toes bilaterally. No evidence of bacterial infection or drainage bilaterally.  Orthopedic  No limitations of motion  feet .  No crepitus or effusions noted.  No bony pathology or digital deformities noted. PTTD riht foot.  Skin  normotropic skin with no porokeratosis noted bilaterally.  No signs of infections or ulcers noted.     Onychomycosis  Pain in right toes  Pain in left toes  Consent was obtained for treatment procedures.   Mechanical debridement of nails 1-5  bilaterally performed with a nail nipper.  Filed with dremel without incident.    Return office visit     3 months                Told patient to return for periodic foot care and evaluation due to potential at risk complications.   Gardiner Barefoot DPM

## 2021-05-03 DIAGNOSIS — R809 Proteinuria, unspecified: Secondary | ICD-10-CM | POA: Diagnosis not present

## 2021-05-03 DIAGNOSIS — N1831 Chronic kidney disease, stage 3a: Secondary | ICD-10-CM | POA: Diagnosis not present

## 2021-05-03 DIAGNOSIS — F3341 Major depressive disorder, recurrent, in partial remission: Secondary | ICD-10-CM | POA: Diagnosis not present

## 2021-05-03 DIAGNOSIS — E1169 Type 2 diabetes mellitus with other specified complication: Secondary | ICD-10-CM | POA: Diagnosis not present

## 2021-05-03 DIAGNOSIS — M8589 Other specified disorders of bone density and structure, multiple sites: Secondary | ICD-10-CM | POA: Diagnosis not present

## 2021-05-03 DIAGNOSIS — F5101 Primary insomnia: Secondary | ICD-10-CM | POA: Diagnosis not present

## 2021-05-15 ENCOUNTER — Encounter: Payer: Self-pay | Admitting: Podiatry

## 2021-05-15 ENCOUNTER — Other Ambulatory Visit: Payer: Self-pay

## 2021-05-15 ENCOUNTER — Ambulatory Visit (INDEPENDENT_AMBULATORY_CARE_PROVIDER_SITE_OTHER): Payer: Medicare Other | Admitting: Podiatry

## 2021-05-15 DIAGNOSIS — B351 Tinea unguium: Secondary | ICD-10-CM

## 2021-05-15 DIAGNOSIS — M79675 Pain in left toe(s): Secondary | ICD-10-CM

## 2021-05-15 DIAGNOSIS — E119 Type 2 diabetes mellitus without complications: Secondary | ICD-10-CM

## 2021-05-15 DIAGNOSIS — M76829 Posterior tibial tendinitis, unspecified leg: Secondary | ICD-10-CM

## 2021-05-15 DIAGNOSIS — M79674 Pain in right toe(s): Secondary | ICD-10-CM | POA: Diagnosis not present

## 2021-05-15 NOTE — Progress Notes (Signed)
This patient returns to my office for at risk foot care.  This patient requires this care by a professional since this patient will be at risk due to having diabetes mellitus.  This patient is unable to cut nails herself since the patient cannot reach her nails.These nails are painful walking and wearing shoes.  This patient presents for at risk foot care today.  General Appearance  Alert, conversant and in no acute stress.  Vascular  Dorsalis pedis and posterior tibial  pulses are palpable  bilaterally.  Capillary return is within normal limits  bilaterally. Temperature is within normal limits  bilaterally.  Neurologic  Senn-Weinstein monofilament wire test within normal limits  bilaterally. Muscle power within normal limits bilaterally.  Nails Thick disfigured discolored nails with subungual debris  from hallux to fifth toes bilaterally. No evidence of bacterial infection or drainage bilaterally.  Orthopedic  No limitations of motion  feet .  No crepitus or effusions noted.  No bony pathology or digital deformities noted. PTTD riht foot.  Skin  normotropic skin with no porokeratosis noted bilaterally.  No signs of infections or ulcers noted.     Onychomycosis  Pain in right toes  Pain in left toes  Consent was obtained for treatment procedures.   Mechanical debridement of nails 1-5  bilaterally performed with a nail nipper.  Filed with dremel without incident.    Return office visit     3 months                Told patient to return for periodic foot care and evaluation due to potential at risk complications.   Gardiner Barefoot DPM

## 2021-05-23 DIAGNOSIS — L02223 Furuncle of chest wall: Secondary | ICD-10-CM | POA: Diagnosis not present

## 2021-05-23 DIAGNOSIS — L0292 Furuncle, unspecified: Secondary | ICD-10-CM | POA: Diagnosis not present

## 2021-08-14 ENCOUNTER — Ambulatory Visit (INDEPENDENT_AMBULATORY_CARE_PROVIDER_SITE_OTHER): Payer: Medicare Other | Admitting: Podiatry

## 2021-08-14 ENCOUNTER — Other Ambulatory Visit: Payer: Self-pay

## 2021-08-14 ENCOUNTER — Encounter: Payer: Self-pay | Admitting: Podiatry

## 2021-08-14 DIAGNOSIS — M79674 Pain in right toe(s): Secondary | ICD-10-CM

## 2021-08-14 DIAGNOSIS — M76829 Posterior tibial tendinitis, unspecified leg: Secondary | ICD-10-CM | POA: Diagnosis not present

## 2021-08-14 DIAGNOSIS — B351 Tinea unguium: Secondary | ICD-10-CM | POA: Diagnosis not present

## 2021-08-14 DIAGNOSIS — M79675 Pain in left toe(s): Secondary | ICD-10-CM | POA: Diagnosis not present

## 2021-08-14 DIAGNOSIS — E119 Type 2 diabetes mellitus without complications: Secondary | ICD-10-CM | POA: Diagnosis not present

## 2021-08-14 NOTE — Progress Notes (Signed)
This patient returns to my office for at risk foot care.  This patient requires this care by a professional since this patient will be at risk due to having diabetes mellitus.  This patient is unable to cut nails herself since the patient cannot reach her nails.These nails are painful walking and wearing shoes.  This patient presents for at risk foot care today. ? ?General Appearance  Alert, conversant and in no acute stress. ? ?Vascular  Dorsalis pedis and posterior tibial  pulses are palpable  bilaterally.  Capillary return is within normal limits  bilaterally. Temperature is within normal limits  bilaterally. ? ?Neurologic  Senn-Weinstein monofilament wire test within normal limits  bilaterally. Muscle power within normal limits bilaterally. ? ?Nails Thick disfigured discolored nails with subungual debris  from hallux to fifth toes bilaterally. No evidence of bacterial infection or drainage bilaterally. ? ?Orthopedic  No limitations of motion  feet .  No crepitus or effusions noted.  No bony pathology or digital deformities noted. PTTD riht foot. ? ?Skin  normotropic skin with no porokeratosis noted bilaterally.  No signs of infections or ulcers noted.    ? ?Onychomycosis  Pain in right toes  Pain in left toes ? ?Consent was obtained for treatment procedures.   Mechanical debridement of nails 1-5  bilaterally performed with a nail nipper.  Filed with dremel without incident.  ? ? ?Return office visit     3 months                Told patient to return for periodic foot care and evaluation due to potential at risk complications. ? ? ?Gardiner Barefoot DPM  ?

## 2021-10-31 DIAGNOSIS — R809 Proteinuria, unspecified: Secondary | ICD-10-CM | POA: Diagnosis not present

## 2021-10-31 DIAGNOSIS — N1831 Chronic kidney disease, stage 3a: Secondary | ICD-10-CM | POA: Diagnosis not present

## 2021-10-31 DIAGNOSIS — K219 Gastro-esophageal reflux disease without esophagitis: Secondary | ICD-10-CM | POA: Diagnosis not present

## 2021-10-31 DIAGNOSIS — E785 Hyperlipidemia, unspecified: Secondary | ICD-10-CM | POA: Diagnosis not present

## 2021-10-31 DIAGNOSIS — E1169 Type 2 diabetes mellitus with other specified complication: Secondary | ICD-10-CM | POA: Diagnosis not present

## 2021-10-31 DIAGNOSIS — F3341 Major depressive disorder, recurrent, in partial remission: Secondary | ICD-10-CM | POA: Diagnosis not present

## 2021-10-31 DIAGNOSIS — F5101 Primary insomnia: Secondary | ICD-10-CM | POA: Diagnosis not present

## 2021-10-31 DIAGNOSIS — Z0000 Encounter for general adult medical examination without abnormal findings: Secondary | ICD-10-CM | POA: Diagnosis not present

## 2021-11-15 ENCOUNTER — Ambulatory Visit (INDEPENDENT_AMBULATORY_CARE_PROVIDER_SITE_OTHER): Payer: Medicare Other | Admitting: Podiatry

## 2021-11-15 ENCOUNTER — Encounter: Payer: Self-pay | Admitting: Podiatry

## 2021-11-15 DIAGNOSIS — B351 Tinea unguium: Secondary | ICD-10-CM

## 2021-11-15 DIAGNOSIS — M79674 Pain in right toe(s): Secondary | ICD-10-CM

## 2021-11-15 DIAGNOSIS — E119 Type 2 diabetes mellitus without complications: Secondary | ICD-10-CM | POA: Diagnosis not present

## 2021-11-15 DIAGNOSIS — M79675 Pain in left toe(s): Secondary | ICD-10-CM | POA: Diagnosis not present

## 2021-11-15 DIAGNOSIS — M76829 Posterior tibial tendinitis, unspecified leg: Secondary | ICD-10-CM | POA: Diagnosis not present

## 2021-11-15 NOTE — Progress Notes (Signed)
This patient returns to my office for at risk foot care.  This patient requires this care by a professional since this patient will be at risk due to having diabetes mellitus.  This patient is unable to cut nails herself since the patient cannot reach her nails.These nails are painful walking and wearing shoes.  This patient presents for at risk foot care today.  General Appearance  Alert, conversant and in no acute stress.  Vascular  Dorsalis pedis and posterior tibial  pulses are palpable  bilaterally.  Capillary return is within normal limits  bilaterally. Temperature is within normal limits  bilaterally.  Neurologic  Senn-Weinstein monofilament wire test within normal limits  bilaterally. Muscle power within normal limits bilaterally.  Nails Thick disfigured discolored nails with subungual debris  from hallux to fifth toes bilaterally. No evidence of bacterial infection or drainage bilaterally.  Orthopedic  No limitations of motion  feet .  No crepitus or effusions noted.  No bony pathology or digital deformities noted. PTTD riht foot.  Skin  normotropic skin with no porokeratosis noted bilaterally.  No signs of infections or ulcers noted.     Onychomycosis  Pain in right toes  Pain in left toes  Consent was obtained for treatment procedures.   Mechanical debridement of nails 1-5  bilaterally performed with a nail nipper.  Filed with dremel without incident.    Return office visit     3 months                Told patient to return for periodic foot care and evaluation due to potential at risk complications.   Gardiner Barefoot DPM

## 2022-02-19 ENCOUNTER — Ambulatory Visit (INDEPENDENT_AMBULATORY_CARE_PROVIDER_SITE_OTHER): Payer: Medicare Other | Admitting: Podiatry

## 2022-02-19 ENCOUNTER — Encounter: Payer: Self-pay | Admitting: Podiatry

## 2022-02-19 DIAGNOSIS — M79675 Pain in left toe(s): Secondary | ICD-10-CM | POA: Diagnosis not present

## 2022-02-19 DIAGNOSIS — B351 Tinea unguium: Secondary | ICD-10-CM | POA: Diagnosis not present

## 2022-02-19 DIAGNOSIS — E119 Type 2 diabetes mellitus without complications: Secondary | ICD-10-CM

## 2022-02-19 DIAGNOSIS — M79674 Pain in right toe(s): Secondary | ICD-10-CM

## 2022-02-19 NOTE — Progress Notes (Signed)
This patient returns to my office for at risk foot care.  This patient requires this care by a professional since this patient will be at risk due to having diabetes mellitus.  This patient is unable to cut nails herself since the patient cannot reach her nails.These nails are painful walking and wearing shoes.  This patient presents for at risk foot care today.  General Appearance  Alert, conversant and in no acute stress.  Vascular  Dorsalis pedis and posterior tibial  pulses are palpable  bilaterally.  Capillary return is within normal limits  bilaterally. Temperature is within normal limits  bilaterally.  Neurologic  Senn-Weinstein monofilament wire test within normal limits  bilaterally. Muscle power within normal limits bilaterally.  Nails Thick disfigured discolored nails with subungual debris  from hallux to fifth toes bilaterally. No evidence of bacterial infection or drainage bilaterally.  Orthopedic  No limitations of motion  feet .  No crepitus or effusions noted.  No bony pathology or digital deformities noted. PTTD right foot.  Skin  normotropic skin with no porokeratosis noted bilaterally.  No signs of infections or ulcers noted.     Onychomycosis  Pain in right toes  Pain in left toes  Consent was obtained for treatment procedures.   Mechanical debridement of nails 1-5  bilaterally performed with a nail nipper.  Filed with dremel without incident.    Return office visit     3 months                Told patient to return for periodic foot care and evaluation due to potential at risk complications.   Malay Fantroy DPM  

## 2022-03-04 DIAGNOSIS — Z23 Encounter for immunization: Secondary | ICD-10-CM | POA: Diagnosis not present

## 2022-04-09 DIAGNOSIS — U071 COVID-19: Secondary | ICD-10-CM | POA: Diagnosis not present

## 2022-05-02 DIAGNOSIS — N1831 Chronic kidney disease, stage 3a: Secondary | ICD-10-CM | POA: Diagnosis not present

## 2022-05-02 DIAGNOSIS — E1169 Type 2 diabetes mellitus with other specified complication: Secondary | ICD-10-CM | POA: Diagnosis not present

## 2022-05-02 DIAGNOSIS — E785 Hyperlipidemia, unspecified: Secondary | ICD-10-CM | POA: Diagnosis not present

## 2022-05-02 DIAGNOSIS — F5101 Primary insomnia: Secondary | ICD-10-CM | POA: Diagnosis not present

## 2022-05-02 DIAGNOSIS — Z6832 Body mass index (BMI) 32.0-32.9, adult: Secondary | ICD-10-CM | POA: Diagnosis not present

## 2022-05-02 DIAGNOSIS — F3341 Major depressive disorder, recurrent, in partial remission: Secondary | ICD-10-CM | POA: Diagnosis not present

## 2022-05-28 ENCOUNTER — Ambulatory Visit (INDEPENDENT_AMBULATORY_CARE_PROVIDER_SITE_OTHER): Payer: Medicare Other | Admitting: Podiatry

## 2022-05-28 ENCOUNTER — Encounter: Payer: Self-pay | Admitting: Podiatry

## 2022-05-28 VITALS — BP 166/76 | HR 72

## 2022-05-28 DIAGNOSIS — M79675 Pain in left toe(s): Secondary | ICD-10-CM

## 2022-05-28 DIAGNOSIS — M76829 Posterior tibial tendinitis, unspecified leg: Secondary | ICD-10-CM

## 2022-05-28 DIAGNOSIS — E119 Type 2 diabetes mellitus without complications: Secondary | ICD-10-CM

## 2022-05-28 DIAGNOSIS — M79674 Pain in right toe(s): Secondary | ICD-10-CM | POA: Diagnosis not present

## 2022-05-28 DIAGNOSIS — B351 Tinea unguium: Secondary | ICD-10-CM | POA: Diagnosis not present

## 2022-05-28 NOTE — Progress Notes (Signed)
This patient returns to my office for at risk foot care.  This patient requires this care by a professional since this patient will be at risk due to having diabetes mellitus.  This patient is unable to cut nails herself since the patient cannot reach her nails.These nails are painful walking and wearing shoes.  This patient presents for at risk foot care today.  General Appearance  Alert, conversant and in no acute stress.  Vascular  Dorsalis pedis and posterior tibial  pulses are palpable  bilaterally.  Capillary return is within normal limits  bilaterally. Temperature is within normal limits  bilaterally.  Neurologic  Senn-Weinstein monofilament wire test within normal limits  bilaterally. Muscle power within normal limits bilaterally.  Nails Thick disfigured discolored nails with subungual debris  from hallux to fifth toes bilaterally. No evidence of bacterial infection or drainage bilaterally.  Orthopedic  No limitations of motion  feet .  No crepitus or effusions noted.  No bony pathology or digital deformities noted. PTTD right foot.  Skin  normotropic skin with no porokeratosis noted bilaterally.  No signs of infections or ulcers noted.     Onychomycosis  Pain in right toes  Pain in left toes  Consent was obtained for treatment procedures.   Mechanical debridement of nails 1-5  bilaterally performed with a nail nipper.  Filed with dremel without incident.    Return office visit     3 months                Told patient to return for periodic foot care and evaluation due to potential at risk complications.   Gardiner Barefoot DPM

## 2022-09-03 ENCOUNTER — Encounter: Payer: Self-pay | Admitting: Podiatry

## 2022-09-03 ENCOUNTER — Ambulatory Visit (INDEPENDENT_AMBULATORY_CARE_PROVIDER_SITE_OTHER): Payer: Medicare Other | Admitting: Podiatry

## 2022-09-03 DIAGNOSIS — M79674 Pain in right toe(s): Secondary | ICD-10-CM | POA: Diagnosis not present

## 2022-09-03 DIAGNOSIS — M79675 Pain in left toe(s): Secondary | ICD-10-CM | POA: Diagnosis not present

## 2022-09-03 DIAGNOSIS — B351 Tinea unguium: Secondary | ICD-10-CM | POA: Diagnosis not present

## 2022-09-03 DIAGNOSIS — E119 Type 2 diabetes mellitus without complications: Secondary | ICD-10-CM

## 2022-09-03 NOTE — Progress Notes (Signed)
This patient returns to my office for at risk foot care.  This patient requires this care by a professional since this patient will be at risk due to having diabetes mellitus.  This patient is unable to cut nails herself since the patient cannot reach her nails.These nails are painful walking and wearing shoes.  This patient presents for at risk foot care today.  General Appearance  Alert, conversant and in no acute stress.  Vascular  Dorsalis pedis and posterior tibial  pulses are palpable  bilaterally.  Capillary return is within normal limits  bilaterally. Temperature is within normal limits  bilaterally.  Neurologic  Senn-Weinstein monofilament wire test within normal limits  bilaterally. Muscle power within normal limits bilaterally.  Nails Thick disfigured discolored nails with subungual debris  from hallux to fifth toes bilaterally. No evidence of bacterial infection or drainage bilaterally.  Orthopedic  No limitations of motion  feet .  No crepitus or effusions noted.  No bony pathology or digital deformities noted. PTTD right foot.  Skin  normotropic skin with no porokeratosis noted bilaterally.  No signs of infections or ulcers noted.     Onychomycosis  Pain in right toes  Pain in left toes  Consent was obtained for treatment procedures.   Mechanical debridement of nails 1-5  bilaterally performed with a nail nipper.  Filed with dremel without incident.    Return office visit     3 months                Told patient to return for periodic foot care and evaluation due to potential at risk complications.   Elfego Giammarino DPM  

## 2022-11-11 DIAGNOSIS — E1169 Type 2 diabetes mellitus with other specified complication: Secondary | ICD-10-CM | POA: Diagnosis not present

## 2022-11-11 DIAGNOSIS — E785 Hyperlipidemia, unspecified: Secondary | ICD-10-CM | POA: Diagnosis not present

## 2022-11-11 DIAGNOSIS — M8589 Other specified disorders of bone density and structure, multiple sites: Secondary | ICD-10-CM | POA: Diagnosis not present

## 2022-11-11 DIAGNOSIS — F5101 Primary insomnia: Secondary | ICD-10-CM | POA: Diagnosis not present

## 2022-11-11 DIAGNOSIS — K219 Gastro-esophageal reflux disease without esophagitis: Secondary | ICD-10-CM | POA: Diagnosis not present

## 2022-11-11 DIAGNOSIS — R809 Proteinuria, unspecified: Secondary | ICD-10-CM | POA: Diagnosis not present

## 2022-11-11 DIAGNOSIS — F3341 Major depressive disorder, recurrent, in partial remission: Secondary | ICD-10-CM | POA: Diagnosis not present

## 2022-11-11 DIAGNOSIS — N1831 Chronic kidney disease, stage 3a: Secondary | ICD-10-CM | POA: Diagnosis not present

## 2022-11-11 DIAGNOSIS — Z0000 Encounter for general adult medical examination without abnormal findings: Secondary | ICD-10-CM | POA: Diagnosis not present

## 2022-12-01 ENCOUNTER — Encounter: Payer: Self-pay | Admitting: Podiatry

## 2022-12-04 ENCOUNTER — Ambulatory Visit: Payer: Medicare Other | Admitting: Podiatry

## 2022-12-08 ENCOUNTER — Encounter: Payer: Self-pay | Admitting: Podiatry

## 2022-12-08 ENCOUNTER — Ambulatory Visit (INDEPENDENT_AMBULATORY_CARE_PROVIDER_SITE_OTHER): Payer: Medicare Other | Admitting: Podiatry

## 2022-12-08 DIAGNOSIS — M79674 Pain in right toe(s): Secondary | ICD-10-CM | POA: Diagnosis not present

## 2022-12-08 DIAGNOSIS — M79675 Pain in left toe(s): Secondary | ICD-10-CM

## 2022-12-08 DIAGNOSIS — B351 Tinea unguium: Secondary | ICD-10-CM

## 2022-12-08 DIAGNOSIS — E119 Type 2 diabetes mellitus without complications: Secondary | ICD-10-CM | POA: Diagnosis not present

## 2022-12-08 NOTE — Progress Notes (Signed)
This patient returns to my office for at risk foot care.  This patient requires this care by a professional since this patient will be at risk due to having diabetes mellitus.  This patient is unable to cut nails herself since the patient cannot reach her nails.These nails are painful walking and wearing shoes.  This patient presents for at risk foot care today.  General Appearance  Alert, conversant and in no acute stress.  Vascular  Dorsalis pedis and posterior tibial  pulses are palpable  bilaterally.  Capillary return is within normal limits  bilaterally. Temperature is within normal limits  bilaterally.  Neurologic  Senn-Weinstein monofilament wire test within normal limits  bilaterally. Muscle power within normal limits bilaterally.  Nails Thick disfigured discolored nails with subungual debris  from hallux to fifth toes bilaterally. No evidence of bacterial infection or drainage bilaterally.  Orthopedic  No limitations of motion  feet .  No crepitus or effusions noted.  No bony pathology or digital deformities noted. PTTD right foot.  Skin  normotropic skin with no porokeratosis noted bilaterally.  No signs of infections or ulcers noted.     Onychomycosis  Pain in right toes  Pain in left toes  Consent was obtained for treatment procedures.   Mechanical debridement of nails 1-5  bilaterally performed with a nail nipper.  Filed with dremel without incident.    Return office visit     3 months                Told patient to return for periodic foot care and evaluation due to potential at risk complications.   Eamon Tantillo DPM  

## 2023-03-06 DIAGNOSIS — F3341 Major depressive disorder, recurrent, in partial remission: Secondary | ICD-10-CM | POA: Diagnosis not present

## 2023-03-06 DIAGNOSIS — R809 Proteinuria, unspecified: Secondary | ICD-10-CM | POA: Diagnosis not present

## 2023-03-06 DIAGNOSIS — F5101 Primary insomnia: Secondary | ICD-10-CM | POA: Diagnosis not present

## 2023-03-06 DIAGNOSIS — M25572 Pain in left ankle and joints of left foot: Secondary | ICD-10-CM | POA: Diagnosis not present

## 2023-03-06 DIAGNOSIS — Z23 Encounter for immunization: Secondary | ICD-10-CM | POA: Diagnosis not present

## 2023-03-11 ENCOUNTER — Ambulatory Visit (INDEPENDENT_AMBULATORY_CARE_PROVIDER_SITE_OTHER): Payer: Medicare Other | Admitting: Podiatry

## 2023-03-11 ENCOUNTER — Encounter: Payer: Self-pay | Admitting: Podiatry

## 2023-03-11 DIAGNOSIS — M79675 Pain in left toe(s): Secondary | ICD-10-CM | POA: Diagnosis not present

## 2023-03-11 DIAGNOSIS — M79674 Pain in right toe(s): Secondary | ICD-10-CM | POA: Diagnosis not present

## 2023-03-11 DIAGNOSIS — B351 Tinea unguium: Secondary | ICD-10-CM | POA: Diagnosis not present

## 2023-03-11 DIAGNOSIS — E119 Type 2 diabetes mellitus without complications: Secondary | ICD-10-CM | POA: Diagnosis not present

## 2023-03-11 NOTE — Progress Notes (Signed)
This patient returns to my office for at risk foot care.  This patient requires this care by a professional since this patient will be at risk due to having diabetes mellitus.  This patient is unable to cut nails herself since the patient cannot reach her nails.These nails are painful walking and wearing shoes.  This patient presents for at risk foot care today.  General Appearance  Alert, conversant and in no acute stress.  Vascular  Dorsalis pedis and posterior tibial  pulses are palpable  bilaterally.  Capillary return is within normal limits  bilaterally. Temperature is within normal limits  bilaterally.  Neurologic  Senn-Weinstein monofilament wire test within normal limits  bilaterally. Muscle power within normal limits bilaterally.  Nails Thick disfigured discolored nails with subungual debris  from hallux to fifth toes bilaterally. No evidence of bacterial infection or drainage bilaterally.  Orthopedic  No limitations of motion  feet .  No crepitus or effusions noted.  No bony pathology or digital deformities noted. PTTD right foot.  Skin  normotropic skin with no porokeratosis noted bilaterally.  No signs of infections or ulcers noted.     Onychomycosis  Pain in right toes  Pain in left toes  Consent was obtained for treatment procedures.   Mechanical debridement of nails 1-5  bilaterally performed with a nail nipper.  Filed with dremel without incident.    Return office visit     3 months                Told patient to return for periodic foot care and evaluation due to potential at risk complications.   Helane Gunther DPM

## 2023-05-07 DIAGNOSIS — F5101 Primary insomnia: Secondary | ICD-10-CM | POA: Diagnosis not present

## 2023-05-07 DIAGNOSIS — E785 Hyperlipidemia, unspecified: Secondary | ICD-10-CM | POA: Diagnosis not present

## 2023-05-07 DIAGNOSIS — E1169 Type 2 diabetes mellitus with other specified complication: Secondary | ICD-10-CM | POA: Diagnosis not present

## 2023-05-07 DIAGNOSIS — R809 Proteinuria, unspecified: Secondary | ICD-10-CM | POA: Diagnosis not present

## 2023-05-07 DIAGNOSIS — F3341 Major depressive disorder, recurrent, in partial remission: Secondary | ICD-10-CM | POA: Diagnosis not present

## 2023-05-07 DIAGNOSIS — M8589 Other specified disorders of bone density and structure, multiple sites: Secondary | ICD-10-CM | POA: Diagnosis not present

## 2023-05-07 DIAGNOSIS — N1831 Chronic kidney disease, stage 3a: Secondary | ICD-10-CM | POA: Diagnosis not present

## 2023-05-07 DIAGNOSIS — K219 Gastro-esophageal reflux disease without esophagitis: Secondary | ICD-10-CM | POA: Diagnosis not present

## 2023-06-01 ENCOUNTER — Ambulatory Visit (INDEPENDENT_AMBULATORY_CARE_PROVIDER_SITE_OTHER): Payer: Medicare Other | Admitting: Podiatry

## 2023-06-01 ENCOUNTER — Encounter: Payer: Self-pay | Admitting: Podiatry

## 2023-06-01 DIAGNOSIS — B351 Tinea unguium: Secondary | ICD-10-CM | POA: Diagnosis not present

## 2023-06-01 DIAGNOSIS — M79675 Pain in left toe(s): Secondary | ICD-10-CM | POA: Diagnosis not present

## 2023-06-01 DIAGNOSIS — E119 Type 2 diabetes mellitus without complications: Secondary | ICD-10-CM

## 2023-06-01 DIAGNOSIS — M79674 Pain in right toe(s): Secondary | ICD-10-CM | POA: Diagnosis not present

## 2023-06-01 NOTE — Progress Notes (Signed)
This patient returns to my office for at risk foot care.  This patient requires this care by a professional since this patient will be at risk due to having diabetes mellitus.  This patient is unable to cut nails herself since the patient cannot reach her nails.These nails are painful walking and wearing shoes.  This patient presents for at risk foot care today.  General Appearance  Alert, conversant and in no acute stress.  Vascular  Dorsalis pedis and posterior tibial  pulses are palpable  bilaterally.  Capillary return is within normal limits  bilaterally. Temperature is within normal limits  bilaterally.  Neurologic  Senn-Weinstein monofilament wire test within normal limits  bilaterally. Muscle power within normal limits bilaterally.  Nails Thick disfigured discolored nails with subungual debris  from hallux to fifth toes bilaterally. No evidence of bacterial infection or drainage bilaterally.  Orthopedic  No limitations of motion  feet .  No crepitus or effusions noted.  No bony pathology or digital deformities noted. PTTD right foot.  Skin  normotropic skin with no porokeratosis noted bilaterally.  No signs of infections or ulcers noted.     Onychomycosis  Pain in right toes  Pain in left toes  Consent was obtained for treatment procedures.   Mechanical debridement of nails 1-5  bilaterally performed with a nail nipper.  Filed with dremel without incident.    Return office visit     3 months                Told patient to return for periodic foot care and evaluation due to potential at risk complications.   Helane Gunther DPM

## 2023-08-10 DIAGNOSIS — E119 Type 2 diabetes mellitus without complications: Secondary | ICD-10-CM | POA: Diagnosis not present

## 2023-08-10 DIAGNOSIS — K219 Gastro-esophageal reflux disease without esophagitis: Secondary | ICD-10-CM | POA: Diagnosis not present

## 2023-08-10 DIAGNOSIS — G4700 Insomnia, unspecified: Secondary | ICD-10-CM | POA: Diagnosis not present

## 2023-08-10 DIAGNOSIS — E6609 Other obesity due to excess calories: Secondary | ICD-10-CM | POA: Diagnosis not present

## 2023-08-10 DIAGNOSIS — E1169 Type 2 diabetes mellitus with other specified complication: Secondary | ICD-10-CM | POA: Diagnosis not present

## 2023-08-10 DIAGNOSIS — E2839 Other primary ovarian failure: Secondary | ICD-10-CM | POA: Diagnosis not present

## 2023-08-10 DIAGNOSIS — E66811 Obesity, class 1: Secondary | ICD-10-CM | POA: Diagnosis not present

## 2023-08-10 DIAGNOSIS — F3340 Major depressive disorder, recurrent, in remission, unspecified: Secondary | ICD-10-CM | POA: Diagnosis not present

## 2023-08-10 DIAGNOSIS — Z6832 Body mass index (BMI) 32.0-32.9, adult: Secondary | ICD-10-CM | POA: Diagnosis not present

## 2023-08-10 DIAGNOSIS — Z1330 Encounter for screening examination for mental health and behavioral disorders, unspecified: Secondary | ICD-10-CM | POA: Diagnosis not present

## 2023-08-10 DIAGNOSIS — E785 Hyperlipidemia, unspecified: Secondary | ICD-10-CM | POA: Diagnosis not present

## 2023-08-10 DIAGNOSIS — M8580 Other specified disorders of bone density and structure, unspecified site: Secondary | ICD-10-CM | POA: Diagnosis not present

## 2023-09-07 ENCOUNTER — Encounter: Payer: Self-pay | Admitting: Podiatry

## 2023-09-07 ENCOUNTER — Ambulatory Visit (INDEPENDENT_AMBULATORY_CARE_PROVIDER_SITE_OTHER): Payer: Medicare Other | Admitting: Podiatry

## 2023-09-07 DIAGNOSIS — M79675 Pain in left toe(s): Secondary | ICD-10-CM

## 2023-09-07 DIAGNOSIS — B351 Tinea unguium: Secondary | ICD-10-CM

## 2023-09-07 DIAGNOSIS — E119 Type 2 diabetes mellitus without complications: Secondary | ICD-10-CM | POA: Diagnosis not present

## 2023-09-07 DIAGNOSIS — M79674 Pain in right toe(s): Secondary | ICD-10-CM

## 2023-09-07 NOTE — Progress Notes (Signed)
This patient returns to my office for at risk foot care.  This patient requires this care by a professional since this patient will be at risk due to having diabetes mellitus.  This patient is unable to cut nails herself since the patient cannot reach her nails.These nails are painful walking and wearing shoes.  This patient presents for at risk foot care today.  General Appearance  Alert, conversant and in no acute stress.  Vascular  Dorsalis pedis and posterior tibial  pulses are palpable  bilaterally.  Capillary return is within normal limits  bilaterally. Temperature is within normal limits  bilaterally.  Neurologic  Senn-Weinstein monofilament wire test within normal limits  bilaterally. Muscle power within normal limits bilaterally.  Nails Thick disfigured discolored nails with subungual debris  from hallux to fifth toes bilaterally. No evidence of bacterial infection or drainage bilaterally.  Orthopedic  No limitations of motion  feet .  No crepitus or effusions noted.  No bony pathology or digital deformities noted. PTTD right foot.  Skin  normotropic skin with no porokeratosis noted bilaterally.  No signs of infections or ulcers noted.     Onychomycosis  Pain in right toes  Pain in left toes  Consent was obtained for treatment procedures.   Mechanical debridement of nails 1-5  bilaterally performed with a nail nipper.  Filed with dremel without incident.    Return office visit     3 months                Told patient to return for periodic foot care and evaluation due to potential at risk complications.   Helane Gunther DPM

## 2023-10-09 ENCOUNTER — Other Ambulatory Visit: Payer: Self-pay

## 2023-10-09 ENCOUNTER — Emergency Department (HOSPITAL_COMMUNITY)

## 2023-10-09 ENCOUNTER — Emergency Department (HOSPITAL_COMMUNITY)
Admission: EM | Admit: 2023-10-09 | Discharge: 2023-10-09 | Disposition: A | Discharge status: Home / Self Care | Attending: Emergency Medicine | Admitting: Emergency Medicine

## 2023-10-09 DIAGNOSIS — R42 Dizziness and giddiness: Secondary | ICD-10-CM | POA: Diagnosis not present

## 2023-10-09 DIAGNOSIS — Z7984 Long term (current) use of oral hypoglycemic drugs: Secondary | ICD-10-CM | POA: Diagnosis not present

## 2023-10-09 DIAGNOSIS — R231 Pallor: Secondary | ICD-10-CM | POA: Diagnosis not present

## 2023-10-09 DIAGNOSIS — N3000 Acute cystitis without hematuria: Secondary | ICD-10-CM

## 2023-10-09 DIAGNOSIS — E119 Type 2 diabetes mellitus without complications: Secondary | ICD-10-CM | POA: Diagnosis not present

## 2023-10-09 DIAGNOSIS — R55 Syncope and collapse: Secondary | ICD-10-CM

## 2023-10-09 DIAGNOSIS — R7989 Other specified abnormal findings of blood chemistry: Secondary | ICD-10-CM | POA: Diagnosis not present

## 2023-10-09 DIAGNOSIS — I959 Hypotension, unspecified: Secondary | ICD-10-CM | POA: Diagnosis not present

## 2023-10-09 DIAGNOSIS — I6381 Other cerebral infarction due to occlusion or stenosis of small artery: Secondary | ICD-10-CM | POA: Diagnosis not present

## 2023-10-09 LAB — URINALYSIS, ROUTINE W REFLEX MICROSCOPIC
Bilirubin Urine: NEGATIVE
Glucose, UA: NEGATIVE mg/dL
Hgb urine dipstick: NEGATIVE
Ketones, ur: 5 mg/dL — AB
Nitrite: NEGATIVE
Protein, ur: 30 mg/dL — AB
Specific Gravity, Urine: 1.017 (ref 1.005–1.030)
WBC, UA: >50 WBC/hpf (ref 0–5)
pH: 5 (ref 5.0–8.0)

## 2023-10-09 LAB — CBC
HCT: 48 % — ABNORMAL HIGH (ref 36.0–46.0)
Hemoglobin: 15.2 g/dL — ABNORMAL HIGH (ref 12.0–15.0)
MCH: 30.2 pg (ref 26.0–34.0)
MCHC: 31.7 g/dL (ref 30.0–36.0)
MCV: 95.2 fL (ref 80.0–100.0)
Platelets: 228 10*3/uL (ref 150–400)
RBC: 5.04 MIL/uL (ref 3.87–5.11)
RDW: 14.3 % (ref 11.5–15.5)
WBC: 11 10*3/uL — ABNORMAL HIGH (ref 4.0–10.5)
nRBC: 0 % (ref 0.0–0.2)

## 2023-10-09 LAB — BASIC METABOLIC PANEL WITH GFR
Anion gap: 10 (ref 5–15)
BUN: 12 mg/dL (ref 8–23)
CO2: 25 mmol/L (ref 22–32)
Calcium: 9.4 mg/dL (ref 8.9–10.3)
Chloride: 103 mmol/L (ref 98–111)
Creatinine, Ser: 1.06 mg/dL — ABNORMAL HIGH (ref 0.44–1.00)
GFR, Estimated: 54 mL/min — ABNORMAL LOW (ref >60)
Glucose, Bld: 142 mg/dL — ABNORMAL HIGH (ref 70–99)
Potassium: 4.4 mmol/L (ref 3.5–5.1)
Sodium: 138 mmol/L (ref 135–145)

## 2023-10-09 LAB — TROPONIN I (HIGH SENSITIVITY): Troponin I (High Sensitivity): 2 ng/L (ref <18)

## 2023-10-09 LAB — CBG MONITORING, ED
Glucose-Capillary: 137 mg/dL — ABNORMAL HIGH (ref 70–99)
Glucose-Capillary: 140 mg/dL — ABNORMAL HIGH (ref 70–99)

## 2023-10-09 MED ORDER — CEPHALEXIN 500 MG PO CAPS: 1000.0000 mg | ORAL_CAPSULE | Freq: Two times a day (BID) | ORAL | 0 refills | Status: AC

## 2023-10-09 NOTE — ED Provider Notes (Signed)
[place] EMERGENCY DEPARTMENT AT [facility] Provider Note   CSN: [id number] Arrival date & time: 10/09/23  1114     History  Chief Complaint  Patient presents with   Near Syncope    Sheri Soto is a 78 y.o. female with history of anxiety, depression, migraines, diabetes, hyperlipidemia, GERD, who presents emergency department after near syncopal episode.  Patient states that she was at her grandchild's school event when she felt very flushed/hot.  She left the auditorium and had to sit down on a bench because she felt like she might pass out.  She does not believe that she fully lost consciousness.  Denies any falls or trauma.  She had been feeling well leading up until today.  She did not have any chest pain or headache.  She feels completely normal now.  Denies any history of cardiac disease.   Near Syncope       Home Medications Prior to Admission medications   Medication Sig Start Date End Date Taking? Authorizing Provider  cephALEXin (KEFLEX) 500 MG capsule Take 2 capsules (1,000 mg total) by mouth 2 (two) times daily for 7 days. 10/09/23 10/16/23 Yes Terrence Wishon T, PA-C  alendronate (FOSAMAX) 35 MG tablet Take 35 mg by mouth once a week. 09/22/19   [provider]  atorvastatin (LIPITOR) 40 MG tablet Take 40 mg by mouth daily. 06/19/17   [provider]  buPROPion (WELLBUTRIN SR) 150 MG 12 hr tablet TAKE 1 TABLET BY MOUTH EVERY MORNING TWICE A DAY 08/13/17   [provider]  calcium carbonate (TUMS - DOSED IN MG ELEMENTAL CALCIUM) 500 MG chewable tablet Chew 1 tablet by mouth daily as needed for indigestion or heartburn.    [provider]  doxylamine, Sleep, (UNISOM) 25 MG tablet Take 25 mg by mouth at bedtime as needed for sleep.    [provider]  escitalopram (LEXAPRO) 10 MG tablet Take 10 mg by mouth daily.    [provider]  FLUAD 0.5 ML SUSY  02/05/18   [provider]  ibuprofen (ADVIL) 200 MG  tablet 3-4 tablet with food or milk as needed    [provider]  lisinopril (ZESTRIL) 2.5 MG tablet Take 2.5 mg by mouth daily. 03/27/20   [provider]  metFORMIN (GLUCOPHAGE-XR) 500 MG 24 hr tablet  02/21/16   [provider]  omeprazole  (PRILOSEC) 20 MG capsule TAKE 2 CAPSULES BY MOUTH ONCE A DAY 06/24/17   [provider]  predniSONE (STERAPRED UNI-PAK 21 TAB) 10 MG (21) TBPK tablet AS DIRECTED 6 5 4 3 2 1  FOR 6 DAYS 05/19/19   [provider]  zolpidem  (AMBIEN ) 10 MG tablet TAKE 1 TABLET BY MOUTH EVERY DAY AT BEDTIME AS NEEDED 08/30/17   [provider]      Allergies    Patient has no known allergies.    Review of Systems   Review of Systems  Cardiovascular:  Positive for near-syncope.  Skin:        Flushing  Neurological:        Near syncope  All other systems reviewed and are negative.   Physical Exam Updated Vital Signs BP (!) 131/93 (BP Location: Right Arm)   Pulse 77   Temp 97.9 F (36.6 C) (Oral)   Resp 16   Ht 5' 1.5" (1.562 m)   Wt 74.8 kg   SpO2 96%   BMI 30.67 kg/m  Physical Exam Vitals and nursing note reviewed.  Constitutional:      Appearance: Normal appearance.  HENT:     Head: Normocephalic and atraumatic.  Eyes:     Conjunctiva/sclera: Conjunctivae normal.  Cardiovascular:     Rate and Rhythm: Normal rate and regular rhythm.  Pulmonary:     Effort: Pulmonary effort is normal. No respiratory distress.     Breath sounds: Normal breath sounds.  Abdominal:     General: There is no distension.     Palpations: Abdomen is soft.     Tenderness: There is no abdominal tenderness.  Musculoskeletal:     Right lower leg: No edema.     Left lower leg: No edema.  Skin:    General: Skin is warm and dry.  Neurological:     General: No focal deficit present.     Mental Status: She is alert.     ED Results / Procedures / Treatments   Labs (all labs ordered are listed, but only abnormal results are  displayed) Labs Reviewed  BASIC METABOLIC PANEL WITH GFR - Abnormal; Notable for the following components:      Result Value   Glucose, Bld 142 (*)    Creatinine, Ser 1.06 (*)    GFR, Estimated 54 (*)    All other components within normal limits  CBC - Abnormal; Notable for the following components:   WBC 11.0 (*)    Hemoglobin 15.2 (*)    HCT 48.0 (*)    All other components within normal limits  URINALYSIS, ROUTINE W REFLEX MICROSCOPIC - Abnormal; Notable for the following components:   Color, Urine AMBER (*)    APPearance CLOUDY (*)    Ketones, ur 5 (*)    Protein, ur 30 (*)    Leukocytes,Ua SMALL (*)    Bacteria, UA MANY (*)    All other components within normal limits  CBG MONITORING, ED - Abnormal; Notable for the following components:   Glucose-Capillary 137 (*)    All other components within normal limits  CBG MONITORING, ED - Abnormal; Notable for the following components:   Glucose-Capillary 140 (*)    All other components within normal limits  TROPONIN I (HIGH SENSITIVITY)    EKG EKG Interpretation Date/Time:  Friday October 09 2023 11:24:20 EDT Ventricular Rate:  78 PR Interval:  154 QRS Duration:  97 QT Interval:  400 QTC Calculation: 456 R Axis:   -43  Text Interpretation: Sinus rhythm Left axis deviation Low voltage, precordial leads Abnormal R-wave progression, late transition Confirmed by Annita Kindle [id number]) on 10/09/2023 2:05:49 PM  Radiology CT Head Wo Contrast Result Date: 10/09/2023 CLINICAL DATA:  78 year old female with lightheaded, diaphoresis, near syncopal event. EXAM: CT HEAD WITHOUT CONTRAST TECHNIQUE: Contiguous axial images were obtained from the base of the skull through the vertex without intravenous contrast. RADIATION DOSE REDUCTION: This exam was performed according to the departmental dose-optimization program which includes automated exposure control, adjustment of the mA and/or kV according to patient size and/or use of iterative  reconstruction technique. COMPARISON:  None Available. FINDINGS: Brain: Cerebral volume is within normal limits for age. No midline shift, ventriculomegaly, mass effect, evidence of mass lesion, intracranial hemorrhage or evidence of cortically based acute infarction. Patchy, mostly periventricular white matter hypodensity is mild to moderate for age. Small but circumscribed appearing lacunar infarct in the anterior right thalamus is probably chronic on series 2, image 14. Other deep gray nuclei appear within normal limits. No cortical encephalomalacia identified. Vascular: Calcified atherosclerosis at the skull base. No suspicious intracranial  vascular hyperdensity. Skull: Intact, negative. Sinuses/Orbits: Visualized paranasal sinuses and mastoids are clear. Other: Visualized orbits and scalp soft tissues are within normal limits. IMPRESSION: 1. Chronic appearing lacunar infarct of the right thalamus. Mild to moderate for age white matter changes most commonly due to small vessel disease. 2. No acute intracranial abnormality identified. Electronically Signed   By: Marlise Simpers M.D.   On: 10/09/2023 14:47    Procedures Procedures    Medications Ordered in ED Medications - No data to display  ED Course/ Medical Decision Making/ A&P                                 Medical Decision Making Amount and/or Complexity of Data Reviewed Labs: ordered. Radiology: ordered.  This patient is a 78 y.o. female  who presents to the ED for concern of near syncopal episode.   Differential diagnoses prior to evaluation: The emergent differential diagnosis includes, but is not limited to,  CVA, ACS, arrhythmia, vasovagal / orthostatic hypotension, sepsis, hypoglycemia, electrolyte disturbance, respiratory failure, anemia, dehydration, heat injury, polypharmacy, malignancy, anxiety/panic attack. This is not an exhaustive differential.   Past Medical History / Co-morbidities / Social History: anxiety, depression,  migraines, diabetes, hyperlipidemia, GERD  Physical Exam: Physical exam performed. The pertinent findings include: Normal vitals, no distress. Heart regular rate and rhythm. No peripheral edema.   Lab Tests/Imaging studies: I personally interpreted labs/imaging and the pertinent results include:  WBC 11, BMP with mildly elevated creatinine. UA with small leukocytes, > 50 WBCs, many bacteria. Troponin 2.  CT head with chronic appearing lacunar infarct of right thalamus, no acute findings.  I agree with the radiologist interpretation.  Cardiac monitoring: EKG obtained and interpreted by myself and attending physician which shows: sinus rhythm   Disposition: After consideration of the diagnostic results and the patients response to treatment, I feel that emergency department workup does not suggest an emergent condition requiring admission or immediate intervention beyond what has been performed at this time. The plan is: discharge to home. Suspect near syncope in the setting of poor PO intake, could consider UTI causing some symptoms as well. Although patient not having urinary symptoms, she has never had a UTI before and urine appears fairly infected today. Low concern for ACS with negative troponin and no ischemic EKG changes. CT head with no acute findings. The patient is safe for discharge and has been instructed to return immediately for worsening symptoms, change in symptoms or any other concerns.  I discussed this case with my attending physician Dr. Drury Geralds who cosigned this note including patient's presenting symptoms, physical exam, and planned diagnostics and interventions. Attending physician stated agreement with plan or made changes to plan which were implemented.   Final Clinical Impression(s) / ED Diagnoses Final diagnoses:  Near syncope  Acute cystitis without hematuria    Rx / DC Orders ED Discharge Orders          Ordered    cephALEXin (KEFLEX) 500 MG capsule  2 times daily         10/09/23 1439           Portions of this report may have been transcribed using voice recognition software. Every effort was made to ensure accuracy; however, inadvertent computerized transcription errors may be present.    Leva Rayas, PA-C 10/09/23 1459    Merdis Stalling, MD 10/09/23 1547    Merdis Stalling, MD 10/09/23  1550  

## 2023-10-09 NOTE — Discharge Instructions (Addendum)
 You are seen the emergency department today for a near syncopal episode.  As we discussed your blood work looked reassuring.  Your urine sample did not show evidence of urinary tract infection.  I have sent some antibiotics to your pharmacy.  I recommend making sure that you are drinking plenty of fluids.  Your CT scan showed you may have previously had a stroke, but there are no new findings.   I recommend following up with your primary doctor regarding your symptoms today.  Continue monitor how you are doing and return to the ER for new or worsening symptoms.

## 2023-10-09 NOTE — ED Triage Notes (Signed)
 Pt c/o near syncopal event. C/o feeling light headed and diaphoretic right before. Sat down. Pt feeling much improved.  AOx4

## 2023-11-11 DIAGNOSIS — Z23 Encounter for immunization: Secondary | ICD-10-CM | POA: Diagnosis not present

## 2023-11-11 DIAGNOSIS — E785 Hyperlipidemia, unspecified: Secondary | ICD-10-CM | POA: Diagnosis not present

## 2023-11-11 DIAGNOSIS — E2839 Other primary ovarian failure: Secondary | ICD-10-CM | POA: Diagnosis not present

## 2023-11-11 DIAGNOSIS — Z0000 Encounter for general adult medical examination without abnormal findings: Secondary | ICD-10-CM | POA: Diagnosis not present

## 2023-11-11 DIAGNOSIS — Z8673 Personal history of transient ischemic attack (TIA), and cerebral infarction without residual deficits: Secondary | ICD-10-CM | POA: Diagnosis not present

## 2023-11-11 DIAGNOSIS — K219 Gastro-esophageal reflux disease without esophagitis: Secondary | ICD-10-CM | POA: Diagnosis not present

## 2023-11-11 DIAGNOSIS — G4700 Insomnia, unspecified: Secondary | ICD-10-CM | POA: Diagnosis not present

## 2023-11-11 DIAGNOSIS — E1169 Type 2 diabetes mellitus with other specified complication: Secondary | ICD-10-CM | POA: Diagnosis not present

## 2023-12-07 ENCOUNTER — Ambulatory Visit (INDEPENDENT_AMBULATORY_CARE_PROVIDER_SITE_OTHER): Admitting: Podiatry

## 2023-12-07 ENCOUNTER — Encounter: Payer: Self-pay | Admitting: Podiatry

## 2023-12-07 DIAGNOSIS — M79675 Pain in left toe(s): Secondary | ICD-10-CM

## 2023-12-07 DIAGNOSIS — E119 Type 2 diabetes mellitus without complications: Secondary | ICD-10-CM | POA: Diagnosis not present

## 2023-12-07 DIAGNOSIS — M79674 Pain in right toe(s): Secondary | ICD-10-CM

## 2023-12-07 DIAGNOSIS — B351 Tinea unguium: Secondary | ICD-10-CM | POA: Diagnosis not present

## 2023-12-07 NOTE — Progress Notes (Signed)
This patient returns to my office for at risk foot care.  This patient requires this care by a professional since this patient will be at risk due to having diabetes mellitus.  This patient is unable to cut nails herself since the patient cannot reach her nails.These nails are painful walking and wearing shoes.  This patient presents for at risk foot care today.  General Appearance  Alert, conversant and in no acute stress.  Vascular  Dorsalis pedis and posterior tibial  pulses are palpable  bilaterally.  Capillary return is within normal limits  bilaterally. Temperature is within normal limits  bilaterally.  Neurologic  Senn-Weinstein monofilament wire test within normal limits  bilaterally. Muscle power within normal limits bilaterally.  Nails Thick disfigured discolored nails with subungual debris  from hallux to fifth toes bilaterally. No evidence of bacterial infection or drainage bilaterally.  Orthopedic  No limitations of motion  feet .  No crepitus or effusions noted.  No bony pathology or digital deformities noted. PTTD right foot.  Skin  normotropic skin with no porokeratosis noted bilaterally.  No signs of infections or ulcers noted.     Onychomycosis  Pain in right toes  Pain in left toes  Consent was obtained for treatment procedures.   Mechanical debridement of nails 1-5  bilaterally performed with a nail nipper.  Filed with dremel without incident.    Return office visit     3 months                Told patient to return for periodic foot care and evaluation due to potential at risk complications.   Helane Gunther DPM

## 2023-12-14 DIAGNOSIS — E2839 Other primary ovarian failure: Secondary | ICD-10-CM | POA: Diagnosis not present

## 2023-12-14 DIAGNOSIS — Z8673 Personal history of transient ischemic attack (TIA), and cerebral infarction without residual deficits: Secondary | ICD-10-CM | POA: Diagnosis not present

## 2023-12-14 DIAGNOSIS — E1169 Type 2 diabetes mellitus with other specified complication: Secondary | ICD-10-CM | POA: Diagnosis not present

## 2023-12-14 DIAGNOSIS — K219 Gastro-esophageal reflux disease without esophagitis: Secondary | ICD-10-CM | POA: Diagnosis not present

## 2023-12-14 DIAGNOSIS — G4700 Insomnia, unspecified: Secondary | ICD-10-CM | POA: Diagnosis not present

## 2023-12-14 DIAGNOSIS — E785 Hyperlipidemia, unspecified: Secondary | ICD-10-CM | POA: Diagnosis not present

## 2024-01-14 DIAGNOSIS — G4700 Insomnia, unspecified: Secondary | ICD-10-CM | POA: Diagnosis not present

## 2024-01-14 DIAGNOSIS — E2839 Other primary ovarian failure: Secondary | ICD-10-CM | POA: Diagnosis not present

## 2024-01-14 DIAGNOSIS — E785 Hyperlipidemia, unspecified: Secondary | ICD-10-CM | POA: Diagnosis not present

## 2024-01-14 DIAGNOSIS — E1169 Type 2 diabetes mellitus with other specified complication: Secondary | ICD-10-CM | POA: Diagnosis not present

## 2024-01-14 DIAGNOSIS — K219 Gastro-esophageal reflux disease without esophagitis: Secondary | ICD-10-CM | POA: Diagnosis not present

## 2024-02-12 DIAGNOSIS — Z8673 Personal history of transient ischemic attack (TIA), and cerebral infarction without residual deficits: Secondary | ICD-10-CM | POA: Diagnosis not present

## 2024-02-12 DIAGNOSIS — E785 Hyperlipidemia, unspecified: Secondary | ICD-10-CM | POA: Diagnosis not present

## 2024-02-12 DIAGNOSIS — M8580 Other specified disorders of bone density and structure, unspecified site: Secondary | ICD-10-CM | POA: Diagnosis not present

## 2024-02-12 DIAGNOSIS — E2839 Other primary ovarian failure: Secondary | ICD-10-CM | POA: Diagnosis not present

## 2024-02-12 DIAGNOSIS — E1169 Type 2 diabetes mellitus with other specified complication: Secondary | ICD-10-CM | POA: Diagnosis not present

## 2024-03-07 ENCOUNTER — Ambulatory Visit (INDEPENDENT_AMBULATORY_CARE_PROVIDER_SITE_OTHER): Admitting: Podiatry

## 2024-03-07 ENCOUNTER — Encounter: Payer: Self-pay | Admitting: Podiatry

## 2024-03-07 DIAGNOSIS — M79675 Pain in left toe(s): Secondary | ICD-10-CM

## 2024-03-07 DIAGNOSIS — M79674 Pain in right toe(s): Secondary | ICD-10-CM

## 2024-03-07 DIAGNOSIS — B351 Tinea unguium: Secondary | ICD-10-CM | POA: Diagnosis not present

## 2024-03-07 DIAGNOSIS — E119 Type 2 diabetes mellitus without complications: Secondary | ICD-10-CM

## 2024-03-07 NOTE — Progress Notes (Signed)
This patient returns to my office for at risk foot care.  This patient requires this care by a professional since this patient will be at risk due to having diabetes mellitus.  This patient is unable to cut nails herself since the patient cannot reach her nails.These nails are painful walking and wearing shoes.  This patient presents for at risk foot care today.  General Appearance  Alert, conversant and in no acute stress.  Vascular  Dorsalis pedis and posterior tibial  pulses are palpable  bilaterally.  Capillary return is within normal limits  bilaterally. Temperature is within normal limits  bilaterally.  Neurologic  Senn-Weinstein monofilament wire test within normal limits  bilaterally. Muscle power within normal limits bilaterally.  Nails Thick disfigured discolored nails with subungual debris  from hallux to fifth toes bilaterally. No evidence of bacterial infection or drainage bilaterally.  Orthopedic  No limitations of motion  feet .  No crepitus or effusions noted.  No bony pathology or digital deformities noted. PTTD right foot.  Skin  normotropic skin with no porokeratosis noted bilaterally.  No signs of infections or ulcers noted.     Onychomycosis  Pain in right toes  Pain in left toes  Consent was obtained for treatment procedures.   Mechanical debridement of nails 1-5  bilaterally performed with a nail nipper.  Filed with dremel without incident.    Return office visit     3 months                Told patient to return for periodic foot care and evaluation due to potential at risk complications.   Helane Gunther DPM

## 2024-04-25 DIAGNOSIS — B028 Zoster with other complications: Secondary | ICD-10-CM | POA: Diagnosis not present

## 2024-04-27 DIAGNOSIS — B028 Zoster with other complications: Secondary | ICD-10-CM | POA: Diagnosis not present

## 2024-05-03 DIAGNOSIS — M792 Neuralgia and neuritis, unspecified: Secondary | ICD-10-CM | POA: Diagnosis not present

## 2024-05-03 DIAGNOSIS — B028 Zoster with other complications: Secondary | ICD-10-CM | POA: Diagnosis not present

## 2024-06-06 ENCOUNTER — Ambulatory Visit: Admitting: Podiatry

## 2024-06-06 ENCOUNTER — Encounter: Payer: Self-pay | Admitting: Podiatry

## 2024-06-06 DIAGNOSIS — B351 Tinea unguium: Secondary | ICD-10-CM | POA: Diagnosis not present

## 2024-06-06 DIAGNOSIS — M79675 Pain in left toe(s): Secondary | ICD-10-CM | POA: Diagnosis not present

## 2024-06-06 DIAGNOSIS — E119 Type 2 diabetes mellitus without complications: Secondary | ICD-10-CM

## 2024-06-06 DIAGNOSIS — M79674 Pain in right toe(s): Secondary | ICD-10-CM | POA: Diagnosis not present

## 2024-06-06 NOTE — Progress Notes (Signed)
This patient returns to my office for at risk foot care.  This patient requires this care by a professional since this patient will be at risk due to having diabetes mellitus.  This patient is unable to cut nails herself since the patient cannot reach her nails.These nails are painful walking and wearing shoes.  This patient presents for at risk foot care today.  General Appearance  Alert, conversant and in no acute stress.  Vascular  Dorsalis pedis and posterior tibial  pulses are palpable  bilaterally.  Capillary return is within normal limits  bilaterally. Temperature is within normal limits  bilaterally.  Neurologic  Senn-Weinstein monofilament wire test within normal limits  bilaterally. Muscle power within normal limits bilaterally.  Nails Thick disfigured discolored nails with subungual debris  from hallux to fifth toes bilaterally. No evidence of bacterial infection or drainage bilaterally.  Orthopedic  No limitations of motion  feet .  No crepitus or effusions noted.  No bony pathology or digital deformities noted. PTTD right foot.  Skin  normotropic skin with no porokeratosis noted bilaterally.  No signs of infections or ulcers noted.     Onychomycosis  Pain in right toes  Pain in left toes  Consent was obtained for treatment procedures.   Mechanical debridement of nails 1-5  bilaterally performed with a nail nipper.  Filed with dremel without incident.    Return office visit     3 months                Told patient to return for periodic foot care and evaluation due to potential at risk complications.   Helane Gunther DPM

## 2024-09-05 ENCOUNTER — Ambulatory Visit: Admitting: Podiatry
